# Patient Record
Sex: Male | Born: 1960 | Race: White | Hispanic: No | Marital: Married | Smoking: Current some day smoker
Health system: Southern US, Community
[De-identification: ages and names within clinical notes are randomized; demographics above are authoritative.]

## PROBLEM LIST (undated history)

## (undated) DIAGNOSIS — I428 Other cardiomyopathies: Secondary | ICD-10-CM

## (undated) DIAGNOSIS — N200 Calculus of kidney: Secondary | ICD-10-CM

## (undated) DIAGNOSIS — R0683 Snoring: Secondary | ICD-10-CM

## (undated) DIAGNOSIS — I4819 Other persistent atrial fibrillation: Secondary | ICD-10-CM

## (undated) DIAGNOSIS — E669 Obesity, unspecified: Secondary | ICD-10-CM

## (undated) DIAGNOSIS — I10 Essential (primary) hypertension: Secondary | ICD-10-CM

## (undated) DIAGNOSIS — E119 Type 2 diabetes mellitus without complications: Secondary | ICD-10-CM

---

## 2003-03-15 ENCOUNTER — Encounter: Payer: Self-pay | Admitting: Emergency Medicine

## 2003-03-15 ENCOUNTER — Emergency Department (HOSPITAL_COMMUNITY): Admission: EM | Admit: 2003-03-15 | Discharge: 2003-03-15 | Payer: Self-pay | Admitting: Emergency Medicine

## 2013-10-30 ENCOUNTER — Emergency Department (HOSPITAL_BASED_OUTPATIENT_CLINIC_OR_DEPARTMENT_OTHER)
Admission: EM | Admit: 2013-10-30 | Discharge: 2013-10-30 | Disposition: A | Discharge status: Home / Self Care | Payer: BC Managed Care – PPO | Attending: Emergency Medicine | Admitting: Emergency Medicine

## 2013-10-30 ENCOUNTER — Encounter (HOSPITAL_BASED_OUTPATIENT_CLINIC_OR_DEPARTMENT_OTHER): Payer: Self-pay | Admitting: Emergency Medicine

## 2013-10-30 ENCOUNTER — Emergency Department (HOSPITAL_BASED_OUTPATIENT_CLINIC_OR_DEPARTMENT_OTHER): Payer: BC Managed Care – PPO

## 2013-10-30 DIAGNOSIS — R079 Chest pain, unspecified: Secondary | ICD-10-CM | POA: Insufficient documentation

## 2013-10-30 DIAGNOSIS — R110 Nausea: Secondary | ICD-10-CM | POA: Insufficient documentation

## 2013-10-30 DIAGNOSIS — Z79899 Other long term (current) drug therapy: Secondary | ICD-10-CM | POA: Insufficient documentation

## 2013-10-30 DIAGNOSIS — F17200 Nicotine dependence, unspecified, uncomplicated: Secondary | ICD-10-CM | POA: Insufficient documentation

## 2013-10-30 DIAGNOSIS — R42 Dizziness and giddiness: Secondary | ICD-10-CM | POA: Insufficient documentation

## 2013-10-30 DIAGNOSIS — E119 Type 2 diabetes mellitus without complications: Secondary | ICD-10-CM | POA: Insufficient documentation

## 2013-10-30 DIAGNOSIS — I10 Essential (primary) hypertension: Secondary | ICD-10-CM | POA: Insufficient documentation

## 2013-10-30 DIAGNOSIS — E669 Obesity, unspecified: Secondary | ICD-10-CM | POA: Insufficient documentation

## 2013-10-30 DIAGNOSIS — R0602 Shortness of breath: Secondary | ICD-10-CM | POA: Insufficient documentation

## 2013-10-30 DIAGNOSIS — R000 Tachycardia, unspecified: Secondary | ICD-10-CM | POA: Insufficient documentation

## 2013-10-30 HISTORY — DX: Essential (primary) hypertension: I10

## 2013-10-30 HISTORY — DX: Type 2 diabetes mellitus without complications: E11.9

## 2013-10-30 LAB — CBC WITH DIFFERENTIAL/PLATELET
BASOS ABS: 0 10*3/uL (ref 0.0–0.1)
Basophils Relative: 0 % (ref 0–1)
EOS PCT: 2 % (ref 0–5)
Eosinophils Absolute: 0.4 10*3/uL (ref 0.0–0.7)
HEMATOCRIT: 46.5 % (ref 39.0–52.0)
HEMOGLOBIN: 16.2 g/dL (ref 13.0–17.0)
LYMPHS ABS: 3 10*3/uL (ref 0.7–4.0)
LYMPHS PCT: 17 % (ref 12–46)
MCH: 30.2 pg (ref 26.0–34.0)
MCHC: 34.8 g/dL (ref 30.0–36.0)
MCV: 86.6 fL (ref 78.0–100.0)
MONO ABS: 1.8 10*3/uL — AB (ref 0.1–1.0)
MONOS PCT: 10 % (ref 3–12)
NEUTROS ABS: 12.4 10*3/uL — AB (ref 1.7–7.7)
Neutrophils Relative %: 71 % (ref 43–77)
Platelets: 299 10*3/uL (ref 150–400)
RBC: 5.37 MIL/uL (ref 4.22–5.81)
RDW: 12.3 % (ref 11.5–15.5)
WBC: 17.5 10*3/uL — AB (ref 4.0–10.5)

## 2013-10-30 LAB — COMPREHENSIVE METABOLIC PANEL
ALBUMIN: 4.5 g/dL (ref 3.5–5.2)
ALT: 41 U/L (ref 0–53)
AST: 25 U/L (ref 0–37)
Alkaline Phosphatase: 47 U/L (ref 39–117)
BUN: 14 mg/dL (ref 6–23)
CALCIUM: 9.8 mg/dL (ref 8.4–10.5)
CO2: 27 mEq/L (ref 19–32)
CREATININE: 0.7 mg/dL (ref 0.50–1.35)
Chloride: 94 mEq/L — ABNORMAL LOW (ref 96–112)
GFR calc Af Amer: >90 mL/min (ref >90)
GFR calc non Af Amer: >90 mL/min (ref >90)
Glucose, Bld: 214 mg/dL — ABNORMAL HIGH (ref 70–99)
Potassium: 3.9 mEq/L (ref 3.7–5.3)
Sodium: 137 mEq/L (ref 137–147)
TOTAL PROTEIN: 8.1 g/dL (ref 6.0–8.3)
Total Bilirubin: 0.2 mg/dL — ABNORMAL LOW (ref 0.3–1.2)

## 2013-10-30 LAB — TROPONIN I: Troponin I: <0.3 ng/mL (ref <0.30)

## 2013-10-30 LAB — D-DIMER, QUANTITATIVE (NOT AT ARMC): D-Dimer, Quant: <0.27 ug/mL-FEU (ref 0.00–0.48)

## 2013-10-30 MED ORDER — ASPIRIN 81 MG PO CHEW
CHEWABLE_TABLET | ORAL | Status: AC
  Filled 2013-10-30: qty 4

## 2013-10-30 MED ORDER — ASPIRIN 81 MG PO CHEW: 81.0000 mg | CHEWABLE_TABLET | Freq: Every day | ORAL | Status: DC

## 2013-10-30 MED ORDER — NITROGLYCERIN 0.4 MG SL SUBL
0.4000 mg | SUBLINGUAL_TABLET | SUBLINGUAL | Status: DC | PRN
  Administered 2013-10-30: 0.4 mg via SUBLINGUAL
  Filled 2013-10-30: qty 25

## 2013-10-30 MED ORDER — NITROGLYCERIN 0.4 MG SL SUBL
SUBLINGUAL_TABLET | SUBLINGUAL | Status: AC
  Filled 2013-10-30: qty 1

## 2013-10-30 MED ORDER — ASPIRIN 81 MG PO CHEW
324.0000 mg | CHEWABLE_TABLET | Freq: Once | ORAL | Status: AC
  Administered 2013-10-30: 324 mg via ORAL

## 2013-10-30 NOTE — ED Provider Notes (Signed)
Care transferred from Dr. Jodi Mourning on 53 y.o. male With intermittent L sided CP.  HEART score 4. First trop negative. Dr. Jodi Mourning discussed inpt admission for ACS r/o vs close outpt f/u.  Pt would like to be d/c home. Delta trop negative. Return precautions given for new or worsening symptoms including return of CP, SOB, diaphoresis. He will call PCP tomorrow, Dr. [place] to arrange outpt f/u,possible stress test.   1. Chest pain       Shanna Cisco, MD 10/30/13 [phone]

## 2013-10-30 NOTE — Discharge Instructions (Signed)
Chest Pain (Nonspecific) °It is often hard to give a specific diagnosis for the cause of chest pain. There is always a chance that your pain could be related to something serious, such as a heart attack or a blood clot in the lungs. You need to follow up with your caregiver for further evaluation. °CAUSES  °· Heartburn. °· Pneumonia or bronchitis. °· Anxiety or stress. °· Inflammation around your heart (pericarditis) or lung (pleuritis or pleurisy). °· A blood clot in the lung. °· A collapsed lung (pneumothorax). It can develop suddenly on its own (spontaneous pneumothorax) or from injury (trauma) to the chest. °· Shingles infection (herpes zoster virus). °The chest wall is composed of bones, muscles, and cartilage. Any of these can be the source of the pain. °· The bones can be bruised by injury. °· The muscles or cartilage can be strained by coughing or overwork. °· The cartilage can be affected by inflammation and become sore (costochondritis). °DIAGNOSIS  °Lab tests or other studies, such as X-rays, electrocardiography, stress testing, or cardiac imaging, may be needed to find the cause of your pain.  °TREATMENT  °· Treatment depends on what may be causing your chest pain. Treatment may include: °· Acid blockers for heartburn. °· Anti-inflammatory medicine. °· Pain medicine for inflammatory conditions. °· Antibiotics if an infection is present. °· You may be advised to change lifestyle habits. This includes stopping smoking and avoiding alcohol, caffeine, and chocolate. °· You may be advised to keep your head raised (elevated) when sleeping. This reduces the chance of acid going backward from your stomach into your esophagus. °· Most of the time, nonspecific chest pain will improve within 2 to 3 days with rest and mild pain medicine. °HOME CARE INSTRUCTIONS  °· If antibiotics were prescribed, take your antibiotics as directed. Finish them even if you start to feel better. °· For the next few days, avoid physical  activities that bring on chest pain. Continue physical activities as directed. °· Do not smoke. °· Avoid drinking alcohol. °· Only take over-the-counter or prescription medicine for pain, discomfort, or fever as directed by your caregiver. °· Follow your caregiver's suggestions for further testing if your chest pain does not go away. °· Keep any follow-up appointments you made. If you do not go to an appointment, you could develop lasting (chronic) problems with pain. If there is any problem keeping an appointment, you must call to reschedule. °SEEK MEDICAL CARE IF:  °· You think you are having problems from the medicine you are taking. Read your medicine instructions carefully. °· Your chest pain does not go away, even after treatment. °· You develop a rash with blisters on your chest. °SEEK IMMEDIATE MEDICAL CARE IF:  °· You have increased chest pain or pain that spreads to your arm, neck, jaw, back, or abdomen. °· You develop shortness of breath, an increasing cough, or you are coughing up blood. °· You have severe back or abdominal pain, feel nauseous, or vomit. °· You develop severe weakness, fainting, or chills. °· You have a fever. °THIS IS AN EMERGENCY. Do not wait to see if the pain will go away. Get medical help at once. Call your local emergency services (911 in [place] Do not drive yourself to the hospital. °MAKE SURE YOU:  °· Understand these instructions. °· Will watch your condition. °· Will get help right away if you are not doing well or get worse. °Document Released: 05/07/2005 Document Revised: 10/20/2011 Document Reviewed: 03/02/2008 °ExitCare® Patient Information ©2014 [employer]   [employer]. ° ° °Aspirin and Your Heart °Aspirin affects the way your blood clots and helps "thin" the blood. Aspirin has many uses in heart disease. It may be used as a primary prevention to help reduce the risk of heart related events. It also can be used as a secondary measure to prevent more heart attacks or to prevent  additional damage from blood clots.  °ASPIRIN MAY HELP IF YOU: °· Have had a heart attack or chest pain. °· Have undergone open heart surgery such as CABG (Coronary Artery Bypass Surgery). °· Have had coronary angioplasty with or without stents. °· Have experienced a stroke or TIA (transient ischemic attack). °· Have peripheral vascular disease (PAD). °· Have chronic heart rhythm problems such as atrial fibrillation. °· Are at risk for heart disease. °BEFORE STARTING ASPIRIN °Before you start taking aspirin, your caregiver will need to review your medical history. Many things will need to be taken into consideration, such as: °· Smoking status. °· Blood pressure. °· Diabetes. °· Gender. °· Weight. °· Cholesterol level. °ASPIRIN DOSES °· Aspirin should only be taken on the advice of your caregiver. Talk to your caregiver about how much aspirin you should take. Aspirin comes in different doses such as: °· 81 mg. °· 162 mg. °· 325 mg. °· The aspirin dose you take may be affected by many factors, some of which include: °· Your current medications, especially if your are taking blood-thinners or anti-platelet medicine. °· Liver function. °· Heart disease risk. °· Age. °· Aspirin comes in two forms: °· Non-enteric-coated. This type of aspirin does not have a coating and is absorbed faster. Non-enteric coated aspirin is recommended for patients experiencing chest pain symptoms. This type of aspirin also comes in a chewable form. °· Enteric-coated. This means the aspirin has a special coating that releases the medicine very slowly. Enteric-coated aspirin causes less stomach upset. This type of aspirin should not be chewed or crushed. °ASPIRIN SIDE EFFECTS °Daily use of aspirin can increase your risk of serious side effects. Some of these include: °· Increased bleeding. This can range from a cut that does not stop bleeding to more serious problems such as stomach bleeding or bleeding into the brain (Intracerebral  bleeding). °· Increased bruising. °· Stomach upset. °· An allergic reaction such as red, itchy skin. °· Increased risk of bleeding when combined with non-steroidal anti-inflammatory medicine (NSAIDS). °· Alcohol should be drank in moderation when taking aspirin. Alcohol can increase the risk of stomach bleeding when taken with aspirin. °· Aspirin should not be given to children less than 18 years of age due to the association of Reye syndrome. Reye syndrome is a serious illness that can affect the brain and liver. Studies have linked Reye syndrome with aspirin use in children. °· People that have nasal polyps have an increased risk of developing an aspirin allergy. °SEEK MEDICAL CARE IF:  °· You develop an allergic reaction such as: °· Hives. °· Itchy skin. °· Swelling of the lips, tongue or face. °· You develop stomach pain. °· You have unusual bleeding or bruising. °· You have ringing in your ears. °SEEK IMMEDIATE MEDICAL CARE IF:  °· You have severe chest pain, especially if the pain is crushing or pressure-like and spreads to the arms, back, neck, or jaw. THIS IS AN EMERGENCY. Do not wait to see if the pain will go away. Get medical help at once. Call your local emergency services (911 in the [place] DO NOT drive yourself to the hospital. °·   You have stroke-like symptoms such as: °· Loss of vision. °· Difficulty talking. °· Numbness or weakness on one side of your body. °· Numbness or weakness in your arm or leg. °· Not thinking clearly or feeling confused. °· Your bowel movements are bloody, dark red or black in color. °· You vomit or cough up blood. °· You have blood in your urine. °· You have shortness of breath, coughing or wheezing. °MAKE SURE YOU:  °· Understand these instructions. °· Will monitor your condition. °· Seek immediate medical care if necessary. °Document Released: 07/10/2008 Document Revised: 11/22/2012 Document Reviewed: 07/10/2008 °ExitCare® Patient Information ©2014 [employer]. ° °

## 2013-10-30 NOTE — ED Notes (Signed)
Patient transported to X-ray via stretcher 

## 2013-10-30 NOTE — ED Notes (Signed)
Pt reports onset of left sided chest pain radiating to left shoulder/left side of his neck this morning.  Positive ROS for diaphoresis, nausea, SHOB.  Deep breathing makes the pain better, standing up and walking makes the pain worse.  No prior history of this pain before.

## 2013-10-30 NOTE — ED Notes (Signed)
Ginger ale offered. Pt tolerated well.

## 2013-10-30 NOTE — ED Provider Notes (Signed)
CSN: [id number]     Arrival date & time 10/30/13  1331 History   First MD Initiated Contact with Patient 10/30/13 1343     Chief Complaint  Patient presents with  . Chest Pain     (Consider location/radiation/quality/duration/timing/severity/associated sxs/prior Treatment) HPI Comments: 53 yo male with DM, obesity, smoking, htn, FH cardiac presents with left sided chest pain with radiation to left shoulder/ neck. Pt has had four 10 minute episodes.  No exertional component.  No hx of similar.  Standing make it worse.  Occasional dry cough.  No cardiac hx known.  Clarified nursing note, pt denies diaphoresis, he says he is always sweaty and there was no worsening during cp.  Mild sob with cp.  Patient denies blood clot history, active cancer, recent major trauma or surgery, unilateral leg swelling/ pain, recent long travel, hemoptysis or oral contraceptives. No back radiation.  No pain currently.   Patient is a 53 y.o. male presenting with chest pain. The history is provided by the patient.  Chest Pain Associated symptoms: nausea   Associated symptoms: no abdominal pain, no back pain, no fever, no headache, no shortness of breath and not vomiting     Past Medical History  Diagnosis Date  . Hypertension   . Diabetes mellitus without complication    History reviewed. No pertinent past surgical history. No family history on file. History  Substance Use Topics  . Smoking status: Current Some Day Smoker  . Smokeless tobacco: Not on file  . Alcohol Use: Yes    Review of Systems  Constitutional: Negative for fever and chills.  HENT: Negative for congestion.   Eyes: Negative for visual disturbance.  Respiratory: Negative for shortness of breath.   Cardiovascular: Positive for chest pain.  Gastrointestinal: Positive for nausea. Negative for vomiting and abdominal pain.  Genitourinary: Negative for dysuria and flank pain.  Musculoskeletal: Negative for back pain, neck pain and neck  stiffness.  Skin: Negative for rash.  Neurological: Positive for light-headedness. Negative for headaches.      Allergies  Review of patient's allergies indicates no known allergies.  Home Medications   Current Outpatient Rx  Name  Route  Sig  Dispense  Refill  . benazepril (LOTENSIN) 10 MG tablet   Oral   Take 10 mg by mouth daily.         . chlorthalidone (HYGROTON) 50 MG tablet   Oral   Take by mouth daily.          BP 120/76  Pulse 99  Temp(Src) 99.1 F (37.3 C) (Oral)  Resp 19  Ht 5\' 10"  (1.778 m)  Wt 288 lb (130.636 kg)  BMI 41.32 kg/m2  SpO2 99% Physical Exam  Nursing note and vitals reviewed. Constitutional: He is oriented to person, place, and time. He appears well-developed and well-nourished.  HENT:  Head: Normocephalic and atraumatic.  Eyes: Conjunctivae are normal. Right eye exhibits no discharge. Left eye exhibits no discharge.  Neck: Normal range of motion. Neck supple. No tracheal deviation present.  Cardiovascular: Regular rhythm.  Tachycardia present.   Pulmonary/Chest: Effort normal and breath sounds normal.  Abdominal: Soft. He exhibits no distension. There is no tenderness. There is no guarding.  Musculoskeletal: He exhibits no edema and no tenderness.  Neurological: He is alert and oriented to person, place, and time.  Skin: Skin is warm. No rash noted.  Psychiatric: He has a normal mood and affect.    ED Course  Procedures (including critical care time) Labs Review  Labs Reviewed  CBC WITH DIFFERENTIAL - Abnormal; Notable for the following:    WBC 17.5 (*)    Neutro Abs 12.4 (*)    Monocytes Absolute 1.8 (*)    All other components within normal limits  COMPREHENSIVE METABOLIC PANEL - Abnormal; Notable for the following:    Chloride 94 (*)    Glucose, Bld 214 (*)    Total Bilirubin 0.2 (*)    All other components within normal limits  TROPONIN I  D-DIMER, QUANTITATIVE  TROPONIN I   Imaging Review Dg Chest 2 View  10/30/2013    CLINICAL DATA:  Left side chest pain radiating to left shoulder and left-sided neck this morning, diaphoresis, shortness of breath, nausea, history hypertension, diabetes  EXAM: CHEST  2 VIEW  COMPARISON:  None  FINDINGS: Normal heart size and pulmonary vascularity.  Tortuous thoracic aorta.  Peribronchial thickening.  No infiltrate, pleural effusion or pneumothorax.  Probable old fracture of the lateral left ninth rib.  No acute osseous findings.  Specifically, no left upper chest or visualize left shoulder abnormality.  IMPRESSION: Mild bronchitic changes.   Electronically Signed   By: Ulyses Southward M.D.   On: 10/30/2013 14:32     EKG Interpretation   Date/Time:  Sunday October 30 2013 13:42:06 EDT Ventricular Rate:  110 PR Interval:  164 QRS Duration: 86 QT Interval:  322 QTC Calculation: 435 R Axis:   14 Text Interpretation:  Sinus tachycardia Possible Left atrial enlargement  Borderline ECG Non specific ST changes Confirmed by Petrina Melby  MD, Delbert Vu  ([id number]) on 10/30/2013 1:47:32 PM     Repeat EKG  Date: 10/30/2013  Rate: 93  Rhythm: normal sinus rhythm  QRS Axis: normal  Intervals: normal  ST/T Wave abnormalities: nonspecific ST changes  Conduction Disutrbances:none  Narrative Interpretation:   Old EKG Reviewed: unchanged   MDM   Final diagnoses:  None  Chest pain DM  Intermittent chest pain with risk factors. Initial ekg non specific findings. HEART score 4. ASA given.  No CP in ED> Repeat EKG no acute changes.  Rechecked, no cp during ED stay. D dimer neg, low risk. Pt feels well on recheck. Recommended observation at [facility] for cardiac work up since sxs intermittent and heart score 4. Patient has capacity to make decisions, understands benefits of hospitalization and risks of going home may result in worsening health condition including heart attack.  Family in the room.  Patient refuses hospital placement. Patient understands they may return at any time.    Signed out  to discharge after second troponin if neg.  Pt understands he can change his mind.    Enid Skeens, MD 10/30/13 [phone]

## 2013-11-25 ENCOUNTER — Encounter: Payer: Self-pay | Admitting: General Surgery

## 2013-11-25 DIAGNOSIS — R079 Chest pain, unspecified: Secondary | ICD-10-CM | POA: Insufficient documentation

## 2013-11-29 ENCOUNTER — Ambulatory Visit: Payer: BC Managed Care – PPO | Admitting: Cardiology

## 2015-02-18 ENCOUNTER — Encounter (HOSPITAL_COMMUNITY): Payer: Self-pay | Admitting: Emergency Medicine

## 2015-02-18 ENCOUNTER — Emergency Department (HOSPITAL_COMMUNITY): Payer: BC Managed Care – PPO

## 2015-02-18 ENCOUNTER — Emergency Department (HOSPITAL_COMMUNITY)
Admission: EM | Admit: 2015-02-18 | Discharge: 2015-02-18 | Disposition: A | Discharge status: Home / Self Care | Payer: BC Managed Care – PPO | Attending: Emergency Medicine | Admitting: Emergency Medicine

## 2015-02-18 DIAGNOSIS — I10 Essential (primary) hypertension: Secondary | ICD-10-CM | POA: Insufficient documentation

## 2015-02-18 DIAGNOSIS — Z79899 Other long term (current) drug therapy: Secondary | ICD-10-CM | POA: Diagnosis not present

## 2015-02-18 DIAGNOSIS — E119 Type 2 diabetes mellitus without complications: Secondary | ICD-10-CM | POA: Insufficient documentation

## 2015-02-18 DIAGNOSIS — Z87442 Personal history of urinary calculi: Secondary | ICD-10-CM | POA: Insufficient documentation

## 2015-02-18 DIAGNOSIS — Z7982 Long term (current) use of aspirin: Secondary | ICD-10-CM | POA: Insufficient documentation

## 2015-02-18 DIAGNOSIS — N23 Unspecified renal colic: Secondary | ICD-10-CM | POA: Insufficient documentation

## 2015-02-18 DIAGNOSIS — R52 Pain, unspecified: Secondary | ICD-10-CM

## 2015-02-18 DIAGNOSIS — Z720 Tobacco use: Secondary | ICD-10-CM | POA: Diagnosis not present

## 2015-02-18 DIAGNOSIS — R109 Unspecified abdominal pain: Secondary | ICD-10-CM | POA: Diagnosis present

## 2015-02-18 HISTORY — DX: Calculus of kidney: N20.0

## 2015-02-18 LAB — CBC WITH DIFFERENTIAL/PLATELET
Basophils Absolute: 0 10*3/uL (ref 0.0–0.1)
Basophils Relative: 0 % (ref 0–1)
EOS PCT: 0 % (ref 0–5)
Eosinophils Absolute: 0.1 10*3/uL (ref 0.0–0.7)
HCT: 48.2 % (ref 39.0–52.0)
Hemoglobin: 16.3 g/dL (ref 13.0–17.0)
LYMPHS ABS: 1.7 10*3/uL (ref 0.7–4.0)
Lymphocytes Relative: 12 % (ref 12–46)
MCH: 29.9 pg (ref 26.0–34.0)
MCHC: 33.8 g/dL (ref 30.0–36.0)
MCV: 88.3 fL (ref 78.0–100.0)
MONOS PCT: 7 % (ref 3–12)
Monocytes Absolute: 1.1 10*3/uL — ABNORMAL HIGH (ref 0.1–1.0)
Neutro Abs: 11.8 10*3/uL — ABNORMAL HIGH (ref 1.7–7.7)
Neutrophils Relative %: 81 % — ABNORMAL HIGH (ref 43–77)
PLATELETS: 244 10*3/uL (ref 150–400)
RBC: 5.46 MIL/uL (ref 4.22–5.81)
RDW: 13 % (ref 11.5–15.5)
WBC: 14.6 10*3/uL — ABNORMAL HIGH (ref 4.0–10.5)

## 2015-02-18 LAB — URINE MICROSCOPIC-ADD ON

## 2015-02-18 LAB — URINALYSIS, ROUTINE W REFLEX MICROSCOPIC
BILIRUBIN URINE: NEGATIVE
Glucose, UA: 250 mg/dL — AB
KETONES UR: 15 mg/dL — AB
LEUKOCYTES UA: NEGATIVE
NITRITE: NEGATIVE
PH: 6 (ref 5.0–8.0)
Protein, ur: NEGATIVE mg/dL
Specific Gravity, Urine: 1.017 (ref 1.005–1.030)
UROBILINOGEN UA: 0.2 mg/dL (ref 0.0–1.0)

## 2015-02-18 LAB — COMPREHENSIVE METABOLIC PANEL
ALK PHOS: 43 U/L (ref 38–126)
ALT: 46 U/L (ref 17–63)
AST: 32 U/L (ref 15–41)
Albumin: 4.6 g/dL (ref 3.5–5.0)
Anion gap: 11 (ref 5–15)
BUN: 10 mg/dL (ref 6–20)
CALCIUM: 9.7 mg/dL (ref 8.9–10.3)
CHLORIDE: 101 mmol/L (ref 101–111)
CO2: 25 mmol/L (ref 22–32)
Creatinine, Ser: 1 mg/dL (ref 0.61–1.24)
GFR calc Af Amer: >60 mL/min (ref >60)
Glucose, Bld: 199 mg/dL — ABNORMAL HIGH (ref 65–99)
POTASSIUM: 4.2 mmol/L (ref 3.5–5.1)
SODIUM: 137 mmol/L (ref 135–145)
Total Bilirubin: 0.7 mg/dL (ref 0.3–1.2)
Total Protein: 7.9 g/dL (ref 6.5–8.1)

## 2015-02-18 MED ORDER — OXYCODONE-ACETAMINOPHEN 5-325 MG PO TABS: 1.0000 | ORAL_TABLET | ORAL | Status: DC | PRN

## 2015-02-18 MED ORDER — ONDANSETRON HCL 4 MG/2ML IJ SOLN: 4.0000 mg | Freq: Once | INTRAMUSCULAR | Status: DC

## 2015-02-18 MED ORDER — HYDROMORPHONE HCL 1 MG/ML IJ SOLN
2.0000 mg | Freq: Once | INTRAMUSCULAR | Status: AC
  Administered 2015-02-18: 2 mg via INTRAMUSCULAR
  Filled 2015-02-18: qty 2

## 2015-02-18 MED ORDER — FENTANYL CITRATE (PF) 100 MCG/2ML IJ SOLN: 50.0000 ug | Freq: Once | INTRAMUSCULAR | Status: DC

## 2015-02-18 MED ORDER — ONDANSETRON 8 MG PO TBDP: 8.0000 mg | ORAL_TABLET | Freq: Three times a day (TID) | ORAL | Status: DC | PRN

## 2015-02-18 MED ORDER — KETOROLAC TROMETHAMINE 60 MG/2ML IM SOLN
60.0000 mg | Freq: Once | INTRAMUSCULAR | Status: AC
  Administered 2015-02-18: 60 mg via INTRAMUSCULAR
  Filled 2015-02-18: qty 2

## 2015-02-18 MED ORDER — ONDANSETRON 4 MG PO TBDP
8.0000 mg | ORAL_TABLET | Freq: Once | ORAL | Status: AC
  Administered 2015-02-18: 8 mg via ORAL
  Filled 2015-02-18: qty 2

## 2015-02-18 NOTE — ED Notes (Signed)
Pt undressed, in gown, on continuous pulse oximetry and blood pressure cuff; visitor at bedside 

## 2015-02-18 NOTE — ED Notes (Signed)
Pt in restroom 

## 2015-02-18 NOTE — ED Notes (Signed)
The pt given more pain meds im will have to wait for 30 minutes after the im injection

## 2015-02-18 NOTE — ED Notes (Signed)
The pt has not voided very  Much for  The past 24 -36 hours rt flank pain.  Hx of kidney stones.  Limited vein visible  Dr Freida Busman no orders for iv will give meds for pain im  instead

## 2015-02-18 NOTE — ED Notes (Signed)
The pts pain is better he has returned from c-t

## 2015-02-18 NOTE — Discharge Instructions (Signed)

## 2015-02-18 NOTE — ED Notes (Signed)
C/o R flank pain and R side pain since yesterday.  History of kidney stones.  Also reports dysuria, nausea, and dry heaves.

## 2015-02-18 NOTE — ED Provider Notes (Signed)
CSN: [id number]     Arrival date & time 02/18/15  1531 History   First MD Initiated Contact with Patient 02/18/15 1627     Chief Complaint  Patient presents with  . Flank Pain     (Consider location/radiation/quality/duration/timing/severity/associated sxs/prior Treatment) HPI Comments: Patient here complaining of colicky right-sided flank pain similar to his prior renal stones. Has been some dark urine but denies any hematuria. Last time he had a kidney stone was 10 years ago. Went to urgent care center and was prescribed doxycycline and Flomax. States that they did not do any imaging or blood work or urine tests. Denies any testicular swelling. No syncope or near CP. Denies any numbness or tingling to his legs. Symptoms persistent and nothing makes them better or worse.  Patient is a 54 y.o. male presenting with flank pain. The history is provided by the patient.  Flank Pain    Past Medical History  Diagnosis Date  . Hypertension   . Diabetes mellitus without complication   . Kidney stones    History reviewed. No pertinent past surgical history. Family History  Problem Relation Age of Onset  . Hypertension Mother   . Hypertension Father   . Prostate cancer Father    History  Substance Use Topics  . Smoking status: Current Some Day Smoker -- 1.00 packs/day    Types: Cigarettes  . Smokeless tobacco: Not on file     Comment: 1 pack per week  . Alcohol Use: Yes     Comment: 12 pk twice/week    Review of Systems  Genitourinary: Positive for flank pain.  All other systems reviewed and are negative.     Allergies  Review of patient's allergies indicates no known allergies.  Home Medications   Prior to Admission medications   Medication Sig Start Date End Date Taking? Authorizing Provider  aspirin 81 MG chewable tablet Chew 1 tablet (81 mg total) by mouth daily. 10/30/13   Toy Cookey, MD  chlorthalidone (HYGROTON) 50 MG tablet Take by mouth daily.    Historical  Provider, MD   BP 186/105 mmHg  Pulse 60  Temp(Src) 98.3 F (36.8 C) (Oral)  Resp 18  Ht 5\' 10"  (1.778 m)  Wt 290 lb (131.543 kg)  BMI 41.61 kg/m2  SpO2 100% Physical Exam  Constitutional: He is oriented to person, place, and time. He appears well-developed and well-nourished.  Non-toxic appearance. No distress.  HENT:  Head: Normocephalic and atraumatic.  Eyes: Conjunctivae, EOM and lids are normal. Pupils are equal, round, and reactive to light.  Neck: Normal range of motion. Neck supple. No tracheal deviation present. No thyroid mass present.  Cardiovascular: Normal rate, regular rhythm and normal heart sounds.  Exam reveals no gallop.   No murmur heard. Pulmonary/Chest: Effort normal and breath sounds normal. No stridor. No respiratory distress. He has no decreased breath sounds. He has no wheezes. He has no rhonchi. He has no rales.  Abdominal: Soft. Normal appearance and bowel sounds are normal. He exhibits no distension. There is no tenderness. There is no rebound and no CVA tenderness.  Musculoskeletal: Normal range of motion. He exhibits no edema or tenderness.  Neurological: He is alert and oriented to person, place, and time. He has normal strength. No cranial nerve deficit or sensory deficit. GCS eye subscore is 4. GCS verbal subscore is 5. GCS motor subscore is 6.  Skin: Skin is warm and dry. No abrasion and no rash noted.  Psychiatric: He has a normal mood  and affect. His speech is normal and behavior is normal.  Nursing note and vitals reviewed.   ED Course  Procedures (including critical care time) Labs Review Labs Reviewed  CBC WITH DIFFERENTIAL/PLATELET - Abnormal; Notable for the following:    WBC 14.6 (*)    Neutrophils Relative % 81 (*)    Neutro Abs 11.8 (*)    Monocytes Absolute 1.1 (*)    All other components within normal limits  COMPREHENSIVE METABOLIC PANEL  URINALYSIS, ROUTINE W REFLEX MICROSCOPIC (NOT AT [facility])    Imaging Review No results  found.   EKG Interpretation None      MDM   Final diagnoses:  Pain    Patient given 2 doses of hydromorphone as well as Toradol. CT scan results reviewed with him. Patient already has a prescription for Flomax and was told to take that. He was told to not take his doxycycline. Will be given referral to urology    Lorre Nick, MD 02/18/15 [phone]

## 2015-02-18 NOTE — ED Notes (Signed)
The pts pain is better  But not gone

## 2015-09-19 ENCOUNTER — Encounter (HOSPITAL_COMMUNITY): Payer: Self-pay | Admitting: Emergency Medicine

## 2015-09-19 ENCOUNTER — Inpatient Hospital Stay (HOSPITAL_COMMUNITY): Payer: BC Managed Care – PPO

## 2015-09-19 ENCOUNTER — Emergency Department (HOSPITAL_COMMUNITY): Payer: BC Managed Care – PPO

## 2015-09-19 ENCOUNTER — Inpatient Hospital Stay (HOSPITAL_COMMUNITY)
Admission: EM | Admit: 2015-09-19 | Discharge: 2015-09-22 | DRG: 286 | Disposition: A | Discharge status: Home / Self Care | Payer: BC Managed Care – PPO | Attending: Cardiovascular Disease | Admitting: Cardiovascular Disease

## 2015-09-19 DIAGNOSIS — J810 Acute pulmonary edema: Secondary | ICD-10-CM | POA: Diagnosis present

## 2015-09-19 DIAGNOSIS — G9341 Metabolic encephalopathy: Secondary | ICD-10-CM | POA: Diagnosis not present

## 2015-09-19 DIAGNOSIS — I428 Other cardiomyopathies: Secondary | ICD-10-CM | POA: Diagnosis present

## 2015-09-19 DIAGNOSIS — J9601 Acute respiratory failure with hypoxia: Secondary | ICD-10-CM | POA: Diagnosis present

## 2015-09-19 DIAGNOSIS — T4275XA Adverse effect of unspecified antiepileptic and sedative-hypnotic drugs, initial encounter: Secondary | ICD-10-CM | POA: Diagnosis not present

## 2015-09-19 DIAGNOSIS — I429 Cardiomyopathy, unspecified: Secondary | ICD-10-CM | POA: Diagnosis not present

## 2015-09-19 DIAGNOSIS — I4891 Unspecified atrial fibrillation: Secondary | ICD-10-CM

## 2015-09-19 DIAGNOSIS — J9600 Acute respiratory failure, unspecified whether with hypoxia or hypercapnia: Secondary | ICD-10-CM

## 2015-09-19 DIAGNOSIS — F17210 Nicotine dependence, cigarettes, uncomplicated: Secondary | ICD-10-CM | POA: Diagnosis present

## 2015-09-19 DIAGNOSIS — I5041 Acute combined systolic (congestive) and diastolic (congestive) heart failure: Secondary | ICD-10-CM | POA: Diagnosis present

## 2015-09-19 DIAGNOSIS — Z6841 Body Mass Index (BMI) 40.0 and over, adult: Secondary | ICD-10-CM | POA: Diagnosis not present

## 2015-09-19 DIAGNOSIS — N179 Acute kidney failure, unspecified: Secondary | ICD-10-CM | POA: Diagnosis present

## 2015-09-19 DIAGNOSIS — Z7289 Other problems related to lifestyle: Secondary | ICD-10-CM | POA: Diagnosis not present

## 2015-09-19 DIAGNOSIS — I420 Dilated cardiomyopathy: Secondary | ICD-10-CM | POA: Diagnosis present

## 2015-09-19 DIAGNOSIS — E669 Obesity, unspecified: Secondary | ICD-10-CM | POA: Diagnosis present

## 2015-09-19 DIAGNOSIS — E876 Hypokalemia: Secondary | ICD-10-CM | POA: Diagnosis not present

## 2015-09-19 DIAGNOSIS — I110 Hypertensive heart disease with heart failure: Secondary | ICD-10-CM | POA: Diagnosis present

## 2015-09-19 DIAGNOSIS — I480 Paroxysmal atrial fibrillation: Principal | ICD-10-CM | POA: Diagnosis present

## 2015-09-19 DIAGNOSIS — I481 Persistent atrial fibrillation: Secondary | ICD-10-CM | POA: Diagnosis present

## 2015-09-19 DIAGNOSIS — E1165 Type 2 diabetes mellitus with hyperglycemia: Secondary | ICD-10-CM | POA: Diagnosis present

## 2015-09-19 DIAGNOSIS — Z01818 Encounter for other preprocedural examination: Secondary | ICD-10-CM

## 2015-09-19 DIAGNOSIS — I5021 Acute systolic (congestive) heart failure: Secondary | ICD-10-CM | POA: Diagnosis not present

## 2015-09-19 DIAGNOSIS — Z4659 Encounter for fitting and adjustment of other gastrointestinal appliance and device: Secondary | ICD-10-CM

## 2015-09-19 DIAGNOSIS — Z8249 Family history of ischemic heart disease and other diseases of the circulatory system: Secondary | ICD-10-CM

## 2015-09-19 DIAGNOSIS — R079 Chest pain, unspecified: Secondary | ICD-10-CM | POA: Diagnosis not present

## 2015-09-19 DIAGNOSIS — R4182 Altered mental status, unspecified: Secondary | ICD-10-CM | POA: Insufficient documentation

## 2015-09-19 DIAGNOSIS — I5031 Acute diastolic (congestive) heart failure: Secondary | ICD-10-CM

## 2015-09-19 DIAGNOSIS — R778 Other specified abnormalities of plasma proteins: Secondary | ICD-10-CM | POA: Diagnosis present

## 2015-09-19 DIAGNOSIS — I4819 Other persistent atrial fibrillation: Secondary | ICD-10-CM | POA: Diagnosis present

## 2015-09-19 DIAGNOSIS — R0602 Shortness of breath: Secondary | ICD-10-CM | POA: Diagnosis present

## 2015-09-19 DIAGNOSIS — R7989 Other specified abnormal findings of blood chemistry: Secondary | ICD-10-CM | POA: Diagnosis present

## 2015-09-19 HISTORY — DX: Snoring: R06.83

## 2015-09-19 HISTORY — DX: Obesity, unspecified: E66.9

## 2015-09-19 HISTORY — DX: Other persistent atrial fibrillation: I48.19

## 2015-09-19 HISTORY — DX: Other cardiomyopathies: I42.8

## 2015-09-19 LAB — I-STAT ARTERIAL BLOOD GAS, ED
ACID-BASE DEFICIT: 10 mmol/L — AB (ref 0.0–2.0)
ACID-BASE DEFICIT: 6 mmol/L — AB (ref 0.0–2.0)
Bicarbonate: 20.8 mEq/L (ref 20.0–24.0)
Bicarbonate: 20.3 mEq/L (ref 20.0–24.0)
O2 SAT: 96 %
O2 Saturation: 98 %
PH ART: 7.096 — AB (ref 7.350–7.450)
PH ART: 7.309 — AB (ref 7.350–7.450)
TCO2: 23 mmol/L (ref 0–100)
TCO2: 21 mmol/L (ref 0–100)
pCO2 arterial: 67.7 mmHg (ref 35.0–45.0)
pCO2 arterial: 40.3 mmHg (ref 35.0–45.0)
pO2, Arterial: 111 mmHg — ABNORMAL HIGH (ref 80.0–100.0)
pO2, Arterial: 109 mmHg — ABNORMAL HIGH (ref 80.0–100.0)

## 2015-09-19 LAB — MAGNESIUM: Magnesium: 2.3 mg/dL (ref 1.7–2.4)

## 2015-09-19 LAB — I-STAT CHEM 8, ED
BUN: 16 mg/dL (ref 6–20)
CALCIUM ION: 1.11 mmol/L — AB (ref 1.12–1.23)
CREATININE: 1.1 mg/dL (ref 0.61–1.24)
Chloride: 102 mmol/L (ref 101–111)
Glucose, Bld: 280 mg/dL — ABNORMAL HIGH (ref 65–99)
HCT: 54 % — ABNORMAL HIGH (ref 39.0–52.0)
Hemoglobin: 18.4 g/dL — ABNORMAL HIGH (ref 13.0–17.0)
Potassium: 3.7 mmol/L (ref 3.5–5.1)
Sodium: 141 mmol/L (ref 135–145)
TCO2: 25 mmol/L (ref 0–100)

## 2015-09-19 LAB — CBC WITH DIFFERENTIAL/PLATELET
BASOS ABS: 0.2 10*3/uL — AB (ref 0.0–0.1)
Basophils Relative: 1 %
EOS ABS: 0.8 10*3/uL — AB (ref 0.0–0.7)
Eosinophils Relative: 5 %
HCT: 50 % (ref 39.0–52.0)
Hemoglobin: 15.7 g/dL (ref 13.0–17.0)
LYMPHS ABS: 8.3 10*3/uL — AB (ref 0.7–4.0)
LYMPHS PCT: 55 %
MCH: 28.6 pg (ref 26.0–34.0)
MCHC: 31.4 g/dL (ref 30.0–36.0)
MCV: 91.1 fL (ref 78.0–100.0)
Monocytes Absolute: 0.9 10*3/uL (ref 0.1–1.0)
Monocytes Relative: 6 %
NEUTROS ABS: 5 10*3/uL (ref 1.7–7.7)
Neutrophils Relative %: 33 %
PLATELETS: 283 10*3/uL (ref 150–400)
RBC: 5.49 MIL/uL (ref 4.22–5.81)
RDW: 13.3 % (ref 11.5–15.5)
WBC: 15.2 10*3/uL — ABNORMAL HIGH (ref 4.0–10.5)

## 2015-09-19 LAB — URINE MICROSCOPIC-ADD ON: RBC / HPF: NONE SEEN RBC/hpf (ref 0–5)

## 2015-09-19 LAB — I-STAT TROPONIN, ED: Troponin i, poc: 0.07 ng/mL (ref 0.00–0.08)

## 2015-09-19 LAB — COMPREHENSIVE METABOLIC PANEL
ALBUMIN: 3.5 g/dL (ref 3.5–5.0)
ALK PHOS: 40 U/L (ref 38–126)
ALT: 28 U/L (ref 17–63)
ANION GAP: 16 — AB (ref 5–15)
AST: 40 U/L (ref 15–41)
BILIRUBIN TOTAL: 0.9 mg/dL (ref 0.3–1.2)
BUN: 11 mg/dL (ref 6–20)
CALCIUM: 8.7 mg/dL — AB (ref 8.9–10.3)
CO2: 20 mmol/L — ABNORMAL LOW (ref 22–32)
Chloride: 105 mmol/L (ref 101–111)
Creatinine, Ser: 1.41 mg/dL — ABNORMAL HIGH (ref 0.61–1.24)
GFR, EST NON AFRICAN AMERICAN: 56 mL/min — AB (ref >60)
GLUCOSE: 288 mg/dL — AB (ref 65–99)
Potassium: 3.6 mmol/L (ref 3.5–5.1)
Sodium: 141 mmol/L (ref 135–145)
TOTAL PROTEIN: 6.3 g/dL — AB (ref 6.5–8.1)

## 2015-09-19 LAB — RAPID URINE DRUG SCREEN, HOSP PERFORMED
Amphetamines: NOT DETECTED
BARBITURATES: NOT DETECTED
Benzodiazepines: NOT DETECTED
Cocaine: NOT DETECTED
Opiates: NOT DETECTED
TETRAHYDROCANNABINOL: NOT DETECTED

## 2015-09-19 LAB — GLUCOSE, CAPILLARY
GLUCOSE-CAPILLARY: 128 mg/dL — AB (ref 65–99)
Glucose-Capillary: 117 mg/dL — ABNORMAL HIGH (ref 65–99)
Glucose-Capillary: 81 mg/dL (ref 65–99)
Glucose-Capillary: 162 mg/dL — ABNORMAL HIGH (ref 65–99)

## 2015-09-19 LAB — URINALYSIS, ROUTINE W REFLEX MICROSCOPIC
Bilirubin Urine: NEGATIVE
GLUCOSE, UA: NEGATIVE mg/dL
Hgb urine dipstick: NEGATIVE
KETONES UR: NEGATIVE mg/dL
LEUKOCYTES UA: NEGATIVE
Nitrite: NEGATIVE
PROTEIN: 100 mg/dL — AB
Specific Gravity, Urine: 1.024 (ref 1.005–1.030)
pH: 5.5 (ref 5.0–8.0)

## 2015-09-19 LAB — PROCALCITONIN: PROCALCITONIN: 0.29 ng/mL

## 2015-09-19 LAB — BRAIN NATRIURETIC PEPTIDE: B NATRIURETIC PEPTIDE 5: 772.9 pg/mL — AB (ref 0.0–100.0)

## 2015-09-19 LAB — HEPARIN LEVEL (UNFRACTIONATED)
HEPARIN UNFRACTIONATED: 0.37 [IU]/mL (ref 0.30–0.70)
Heparin Unfractionated: 0.35 IU/mL (ref 0.30–0.70)

## 2015-09-19 LAB — MRSA PCR SCREENING: MRSA BY PCR: NEGATIVE

## 2015-09-19 LAB — TSH: TSH: 2.044 u[IU]/mL (ref 0.350–4.500)

## 2015-09-19 LAB — ETHANOL: Alcohol, Ethyl (B): <5 mg/dL (ref <5)

## 2015-09-19 LAB — LIPASE, BLOOD: LIPASE: 32 U/L (ref 11–51)

## 2015-09-19 LAB — TROPONIN I
TROPONIN I: 0.11 ng/mL — AB (ref <0.031)
Troponin I: 0.1 ng/mL — ABNORMAL HIGH (ref <0.031)

## 2015-09-19 LAB — LACTIC ACID, PLASMA
LACTIC ACID, VENOUS: 1.5 mmol/L (ref 0.5–2.0)
Lactic Acid, Venous: 1.4 mmol/L (ref 0.5–2.0)

## 2015-09-19 LAB — I-STAT CG4 LACTIC ACID, ED: Lactic Acid, Venous: 5.26 mmol/L (ref 0.5–2.0)

## 2015-09-19 MED ORDER — FENTANYL CITRATE (PF) 2500 MCG/50ML IJ SOLN
150.0000 ug/h | INTRAMUSCULAR | Status: DC
  Administered 2015-09-19: 150 ug/h via INTRAVENOUS
  Filled 2015-09-19: qty 50

## 2015-09-19 MED ORDER — NITROGLYCERIN IN D5W 200-5 MCG/ML-% IV SOLN
INTRAVENOUS | Status: AC
  Filled 2015-09-19: qty 250

## 2015-09-19 MED ORDER — PIPERACILLIN-TAZOBACTAM 3.375 G IVPB
3.3750 g | Freq: Three times a day (TID) | INTRAVENOUS | Status: DC
  Administered 2015-09-19 – 2015-09-20 (×3): 3.375 g via INTRAVENOUS
  Filled 2015-09-19 (×5): qty 50

## 2015-09-19 MED ORDER — ROCURONIUM BROMIDE 50 MG/5ML IV SOLN
100.0000 mg | Freq: Once | INTRAVENOUS | Status: AC
  Administered 2015-09-19: 100 mg via INTRAVENOUS
  Filled 2015-09-19: qty 10

## 2015-09-19 MED ORDER — PANTOPRAZOLE SODIUM 40 MG IV SOLR
40.0000 mg | Freq: Every day | INTRAVENOUS | Status: DC
  Administered 2015-09-19: 40 mg via INTRAVENOUS
  Filled 2015-09-19: qty 40

## 2015-09-19 MED ORDER — SODIUM CHLORIDE 0.9 % IV SOLN
25.0000 ug/h | INTRAVENOUS | Status: DC
  Administered 2015-09-19: 100 ug/h via INTRAVENOUS
  Filled 2015-09-19: qty 50

## 2015-09-19 MED ORDER — SODIUM CHLORIDE 0.9 % IV SOLN
1500.0000 mg | Freq: Once | INTRAVENOUS | Status: AC
  Administered 2015-09-19: 1500 mg via INTRAVENOUS
  Filled 2015-09-19: qty 1500

## 2015-09-19 MED ORDER — SODIUM CHLORIDE 0.9 % IV SOLN
1.0000 g | Freq: Once | INTRAVENOUS | Status: AC
  Administered 2015-09-19: 1 g via INTRAVENOUS
  Filled 2015-09-19: qty 10

## 2015-09-19 MED ORDER — FENTANYL CITRATE (PF) 100 MCG/2ML IJ SOLN
50.0000 ug | Freq: Once | INTRAMUSCULAR | Status: AC
  Administered 2015-09-21: 50 ug via INTRAVENOUS
  Filled 2015-09-19: qty 2

## 2015-09-19 MED ORDER — MIDAZOLAM HCL 2 MG/2ML IJ SOLN: 2.0000 mg | INTRAMUSCULAR | Status: DC | PRN

## 2015-09-19 MED ORDER — PROCAINAMIDE HCL 100 MG/ML IJ SOLN
100.0000 mg | Freq: Once | INTRAMUSCULAR | Status: AC
  Administered 2015-09-19: 100 mg via INTRAVENOUS
  Filled 2015-09-19: qty 1

## 2015-09-19 MED ORDER — FUROSEMIDE 10 MG/ML IJ SOLN
INTRAMUSCULAR | Status: AC
  Filled 2015-09-19: qty 4

## 2015-09-19 MED ORDER — ETOMIDATE 2 MG/ML IV SOLN
30.0000 mg | Freq: Once | INTRAVENOUS | Status: AC
  Administered 2015-09-19: 30 mg via INTRAVENOUS

## 2015-09-19 MED ORDER — NITROGLYCERIN IN D5W 200-5 MCG/ML-% IV SOLN
100.0000 ug/min | Freq: Once | INTRAVENOUS | Status: AC
  Administered 2015-09-19: 50 ug/min via INTRAVENOUS

## 2015-09-19 MED ORDER — SODIUM CHLORIDE 0.9 % IV SOLN
Freq: Once | INTRAVENOUS | Status: AC
  Administered 2015-09-19: 03:00:00 via INTRAVENOUS

## 2015-09-19 MED ORDER — HEPARIN (PORCINE) IN NACL 100-0.45 UNIT/ML-% IJ SOLN
1700.0000 [IU]/h | INTRAMUSCULAR | Status: DC
  Administered 2015-09-19 – 2015-09-21 (×4): 1500 [IU]/h via INTRAVENOUS
  Filled 2015-09-19 (×4): qty 250

## 2015-09-19 MED ORDER — LEVALBUTEROL HCL 0.63 MG/3ML IN NEBU
0.6300 mg | INHALATION_SOLUTION | RESPIRATORY_TRACT | Status: DC | PRN
  Filled 2015-09-19: qty 3

## 2015-09-19 MED ORDER — SODIUM CHLORIDE 0.9 % IV SOLN
INTRAVENOUS | Status: DC
  Administered 2015-09-19: 06:00:00 via INTRAVENOUS

## 2015-09-19 MED ORDER — CHLORHEXIDINE GLUCONATE 0.12% ORAL RINSE (MEDLINE KIT)
15.0000 mL | Freq: Two times a day (BID) | OROMUCOSAL | Status: DC
  Administered 2015-09-19: 15 mL via OROMUCOSAL

## 2015-09-19 MED ORDER — FUROSEMIDE 10 MG/ML IJ SOLN
40.0000 mg | Freq: Once | INTRAMUSCULAR | Status: AC
  Administered 2015-09-19: 40 mg via INTRAVENOUS

## 2015-09-19 MED ORDER — SODIUM CHLORIDE 0.9 % IV SOLN: INTRAVENOUS | Status: DC | PRN

## 2015-09-19 MED ORDER — ANTISEPTIC ORAL RINSE SOLUTION (CORINZ)
7.0000 mL | Freq: Four times a day (QID) | OROMUCOSAL | Status: DC
  Administered 2015-09-19: 7 mL via OROMUCOSAL

## 2015-09-19 MED ORDER — VANCOMYCIN HCL 10 G IV SOLR
1250.0000 mg | Freq: Two times a day (BID) | INTRAVENOUS | Status: DC
  Administered 2015-09-19 – 2015-09-20 (×2): 1250 mg via INTRAVENOUS
  Filled 2015-09-19 (×4): qty 1250

## 2015-09-19 MED ORDER — NOREPINEPHRINE BITARTRATE 1 MG/ML IV SOLN
2.0000 ug/min | INTRAVENOUS | Status: DC
  Filled 2015-09-19: qty 4

## 2015-09-19 MED ORDER — PIPERACILLIN-TAZOBACTAM 3.375 G IVPB 30 MIN
3.3750 g | Freq: Once | INTRAVENOUS | Status: AC
  Administered 2015-09-19: 3.375 g via INTRAVENOUS
  Filled 2015-09-19: qty 50

## 2015-09-19 MED ORDER — SODIUM CHLORIDE 0.9 % IV SOLN: 250.0000 mL | INTRAVENOUS | Status: DC | PRN

## 2015-09-19 MED ORDER — INSULIN ASPART 100 UNIT/ML ~~LOC~~ SOLN
0.0000 [IU] | SUBCUTANEOUS | Status: DC
  Administered 2015-09-19: 3 [IU] via SUBCUTANEOUS
  Administered 2015-09-19: 4 [IU] via SUBCUTANEOUS
  Administered 2015-09-20 – 2015-09-21 (×2): 3 [IU] via SUBCUTANEOUS

## 2015-09-19 MED ORDER — PROCAINAMIDE HCL 100 MG/ML IJ SOLN
300.0000 mg | Freq: Once | INTRAMUSCULAR | Status: DC
  Filled 2015-09-19: qty 3

## 2015-09-19 MED ORDER — FENTANYL BOLUS VIA INFUSION
50.0000 ug | INTRAVENOUS | Status: DC | PRN
  Filled 2015-09-19: qty 50

## 2015-09-19 NOTE — Progress Notes (Signed)
Patient transferred from ED to [facility] without any complications.

## 2015-09-19 NOTE — Discharge Instructions (Signed)

## 2015-09-19 NOTE — Progress Notes (Signed)
eLink Physician-Brief Progress Note Patient Name: YOSMAR JOHNES DOB: March 01, 1961 MRN: [id number]   Date of Service  09/19/2015  HPI/Events of Note  Camera check patient post extubation today. No respiratory distress. Resting comfortably in bed.  eICU Interventions  Continue current plan of care.     Intervention Category Intermediate Interventions: Other:  Lawanda Cousins 09/19/2015, 10:06 PM

## 2015-09-19 NOTE — Progress Notes (Signed)
ANTICOAGULATION CONSULT NOTE   Pharmacy Consult for heparin Indication: atrial fibrillation  No Known Allergies  Patient Measurements: Height: 6' (182.9 cm) Weight: 258 lb 9.6 oz (117.3 kg) IBW/kg (Calculated) : 77.6 Heparin Dosing Weight: 105kg  Vital Signs: Temp: 97.8 F (36.6 C) (02/08 0800) Temp Source: Oral (02/08 0800) BP: 133/85 mmHg (02/08 1215) Pulse Rate: 74 (02/08 1215)  Labs:  Recent Labs  09/19/15 0240 09/19/15 0319 09/19/15 0820 09/19/15 1236  HGB 15.7 18.4*  --   --   HCT 50.0 54.0*  --   --   PLT 283  --   --   --   HEPARINUNFRC  --   --   --  0.37  CREATININE 1.41* 1.10  --   --   TROPONINI  --   --  0.10*  --     Estimated Creatinine Clearance: 103.9 mL/min (by C-G formula based on Cr of 1.1).    Assessment: 55yo male c/o CP and SOB, found to be in Afib s/p successful DCCV in ED. He continues on heparin.and at goal on 1500 units/hr. Plans for NOAC when more stable (extubated today).  Goal of Therapy:  Heparin level 0.3-0.7 units/ml Monitor platelets by anticoagulation protocol: Yes   Plan:  -No heparin changes needed -Heparin level in 6 hours and daily wth CBC daily -Will follow oral anticoagulation plans  Harland German, Pharm D 09/19/2015 1:21 PM

## 2015-09-19 NOTE — Progress Notes (Signed)
09/19/2015 1215- Pt extubated to 4L [place]. No stridor noted. Pt BBS equal and dim. Pt able to cough up secretions. Pt able to vocalize name. RN will continue to monitor.

## 2015-09-19 NOTE — Progress Notes (Signed)
MD placed pt on wean 

## 2015-09-19 NOTE — ED Provider Notes (Signed)
CSN: [id number]     Arrival date & time 09/19/15  0225 History  By signing my name below, I, Marvin Estrada, attest that this documentation has been prepared under the direction and in the presence of Tomasita Crumble, MD. Electronically Signed: Bethel Estrada, ED Scribe. 09/19/2015. 3:32 AM   Chief Complaint  Patient presents with  . Tachycardia    The patient went to the fire station due to chest pain and sob that started yesterday.  Fire Department called EMS.  EMS  placed an IV  and gave 6mg , 12mg  and 12mg .  EMS did give him 20mg  of Cardizem, then after gave him 10mg  of Cardizem and he is now on 5mg  of Cardizem.  Marvin Estrada Shortness of Breath   Level V caveat secondary to the acuity of the presenting condition  The history is provided by the EMS personnel. No language interpreter was used.   Brought in by EMS, Marvin Estrada is a 55 y.o. male with history of HTN and DM who presents to the Emergency Department complaining of chest pain and SOB with onset yesterday. Associated symptoms include diaphoresis. Pt denies recent illness. He was noted to be in a-fib by EMS and given adenosine without improvement.   Past Medical History  Diagnosis Date  . Hypertension   . Diabetes mellitus without complication (HCC)   . Kidney stones    History reviewed. No pertinent past surgical history. Family History  Problem Relation Age of Onset  . Hypertension Mother   . Hypertension Father   . Prostate cancer Father    Social History  Substance Use Topics  . Smoking status: Current Some Day Smoker -- 1.00 packs/day    Types: Cigarettes  . Smokeless tobacco: None     Comment: 1 pack per week  . Alcohol Use: Yes     Comment: 12 pk twice/week    Review of Systems  Unable to perform ROS: Acuity of condition    Allergies  Review of patient's allergies indicates no known allergies.  Home Medications   Prior to Admission medications   Medication Sig Start Date End Date Taking? Authorizing  Provider  acetaminophen (TYLENOL) 500 MG tablet Take 1,000 mg by mouth every 6 (six) hours as needed for moderate pain.   Yes Historical Provider, MD  fluticasone (FLONASE) 50 MCG/ACT nasal spray Place 1 spray into both nostrils as needed for allergies or rhinitis.   Yes Historical Provider, MD  omeprazole (PRILOSEC) 20 MG capsule Take 20 mg by mouth as needed (FOR HEARTBURN).   Yes Historical Provider, MD   BP 115/80 mmHg  Pulse 63  Temp(Src) 99.5 F (37.5 C) (Core (Comment))  Resp 26  SpO2 98% Physical Exam  Constitutional: Vital signs are normal. He appears well-developed and well-nourished.  Non-toxic appearance. He does not appear ill. He appears distressed.  HENT:  Head: Normocephalic and atraumatic.  Nose: Nose normal.  Mouth/Throat: Oropharynx is clear and moist. No oropharyngeal exudate.  Eyes: Conjunctivae and EOM are normal. Pupils are equal, round, and reactive to light. No scleral icterus.  Neck: Normal range of motion. Neck supple. No tracheal deviation, no edema, no erythema and normal range of motion present. No thyroid mass and no thyromegaly present.  Cardiovascular: S1 normal, S2 normal, normal heart sounds, intact distal pulses and normal pulses.  An irregularly irregular rhythm present. Tachycardia present.  Exam reveals no gallop and no friction rub.   No murmur heard. Pulmonary/Chest: Accessory muscle usage present. Tachypnea noted. He is in  respiratory distress. He has no wheezes. He has no rhonchi. He has no rales.  Increased work of breathing  Abdominal: Soft. Normal appearance and bowel sounds are normal. He exhibits no distension, no ascites and no mass. There is no hepatosplenomegaly. There is no tenderness. There is no rebound, no guarding and no CVA tenderness.  Musculoskeletal: Normal range of motion. He exhibits no edema or tenderness.  Lymphadenopathy:    He has no cervical adenopathy.  Neurological: He has normal strength. No sensory deficit.  Drowsy,  minimally responsive to questions, going in and out of consciousness.   Skin: Skin is warm and intact. No petechiae and no rash noted. He is diaphoretic. There is cyanosis. No erythema. No pallor.  Nursing note and vitals reviewed.   ED Course  Procedures (including critical care time)  EMERGENT INTUBATION PROCEDURE NOTE INDICATION: respiratory failure  TECHNIQUE: Unable to obtain consent because of emergent medical necessity.  After pre-oxygenating the patient for 3 minutes, a modified rapid-sequence induction was performed using etomidate and rocuronium with cricoid pressure. Using direct laryngoscopy and 8.59mm cuffed endotracheal tube was placed and secured.  Placement was confirmed with by auscultation, by CXR and ETCO2 monitor.  COMPLICATIONS: None. The patient tolerated the procedure well with no complications. POST PROCEDURE CXR: tube position acceptable   CRITICAL CARE Performed by: Tomasita Crumble, MD Total critical care time: 80 minutes Critical care time was exclusive of separately billable procedures and treating other patients. Critical care was necessary to treat or prevent imminent or life-threatening deterioration. Critical care was time spent personally by me on the following activities: development of treatment plan with patient and/or surrogate as well as nursing, discussions with consultants, evaluation of patient's response to treatment, examination of patient, obtaining history from patient or surrogate, ordering and performing treatments and interventions, ordering and review of laboratory studies, ordering and review of radiographic studies, pulse oximetry and re-evaluation of patient's condition.    COORDINATION OF CARE: 2:50 AM I updated the patient's family and discussed the treatment plan.   3:10 AM-Consult complete with Dr. Leeann Must (Cardiology). Patient case explained and discussed.  Call ended at 3:14 AM  3:31 AM D/w Dr. Tresa Endo who has seen the pt. He is in  agreement with the treatment plan.    Labs Review Labs Reviewed  COMPREHENSIVE METABOLIC PANEL - Abnormal; Notable for the following:    CO2 20 (*)    Glucose, Bld 288 (*)    Creatinine, Ser 1.41 (*)    Calcium 8.7 (*)    Total Protein 6.3 (*)    GFR calc non Af Amer 56 (*)    Anion gap 16 (*)    All other components within normal limits  CBC WITH DIFFERENTIAL/PLATELET - Abnormal; Notable for the following:    WBC 15.2 (*)    Lymphs Abs 8.3 (*)    Eosinophils Absolute 0.8 (*)    Basophils Absolute 0.2 (*)    All other components within normal limits  I-STAT ARTERIAL BLOOD GAS, ED - Abnormal; Notable for the following:    pH, Arterial 7.096 (*)    pCO2 arterial 67.7 (*)    pO2, Arterial 111.0 (*)    Acid-base deficit 10.0 (*)    All other components within normal limits  I-STAT CG4 LACTIC ACID, ED - Abnormal; Notable for the following:    Lactic Acid, Venous 5.26 (*)    All other components within normal limits  I-STAT CHEM 8, ED - Abnormal; Notable for the following:  Glucose, Bld 280 (*)    Calcium, Ion 1.11 (*)    Hemoglobin 18.4 (*)    HCT 54.0 (*)    All other components within normal limits  CULTURE, BLOOD (ROUTINE X 2)  CULTURE, BLOOD (ROUTINE X 2)  CULTURE, BLOOD (ROUTINE X 2) W REFLEX TO ID PANEL  LIPASE, BLOOD  ETHANOL  MAGNESIUM  CBC  URINALYSIS, ROUTINE W REFLEX MICROSCOPIC (NOT AT [facility])  BRAIN NATRIURETIC PEPTIDE  URINE RAPID DRUG SCREEN, HOSP PERFORMED  I-STAT TROPOININ, ED  Rosezena Sensor, ED    Imaging Review Dg Chest Portable 1 View  09/19/2015  CLINICAL DATA:  54 year old male status post intubation. EXAM: PORTABLE CHEST 1 VIEW COMPARISON:  Radiograph dated 10/30/2013 FINDINGS: Endotracheal tube with tip approximately 4.5 cm above the carina. The diffuse bilateral interstitial prominence. Patchy area of ground-glass airspace opacity noted in the right mid and lower lung field, increased from prior study. No pleural effusion. The left costophrenic  angle has been excluded from the image. No pneumothorax. The cardiac silhouette is within normal limits. Multiple old right rib fractures. IMPRESSION: Endotracheal tube above the carina. Patchy right lung ground-glass opacity, increased from prior study. Electronically Signed   By: Elgie Collard M.D.   On: 09/19/2015 02:50   I have personally reviewed and evaluated these images and lab results as part of my medical decision-making.   EKG Interpretation   Date/Time:  Wednesday September 19 2015 03:15:35 EST Ventricular Rate:  111 PR Interval:  177 QRS Duration: 85 QT Interval:  385 QTC Calculation: 523 R Axis:   102 Text Interpretation:  Sinus tachycardia Left atrial enlargement Right axis  deviation Anteroseptal infarct, old Abnrm T, consider ischemia,  anterolateral lds Prolonged QT interval afib resolved Confirmed by Erroll Luna [id number]) on 09/19/2015 4:10:17 AM      MDM   Final diagnoses:  None   Patient presents to the emergency department for severe shortness of breath and tachycardia. Upon arrival to the emergency department he was critically ill. With a nonrebreather he has 75%. He continued to be diaphoretic, tachypneic, and fading in and out of consciousness. Patient was immediately intubated upon arrival.  Oxygen saturations rose to 100%. He continued to be in a tachycardia. EKG to me appears to be possible A. fib with WPW. Patient's heart rate is in the 200s, with rapidly changing QRS waves. Paramedics had given the patient adenosine, metoprolol, diltiazem all of which did not work. I tried procainamide 2 which did not work. I spoke with Dr. Tresa Endo with cardiology who recommended to shock the patient. This works at [employer] on the first attempt. Repeat EKG reveals normal sinus rhythm with some ST depressions and T-wave inversions in the leads V4 through V6. Cardiology recommended for ICU admission. Patient was covered with a dose of broad-spectrum antibiotics however I  doubt sepsis. This is likely a primary cardiac event causing fluid overload, pulmonary edema and respiratory failure. Patient was initially on a nitroglycerin drip for the fluid overload. I spoke with Dr. Hosie Poisson with the intensivist who accepts the patient for admission.   I personally performed the services described in this documentation, which was scribed in my presence. The recorded information has been reviewed and is accurate.     Tomasita Crumble, MD 09/19/15 [phone]

## 2015-09-19 NOTE — Progress Notes (Signed)
ANTICOAGULATION CONSULT NOTE   Pharmacy Consult for heparin Indication: atrial fibrillation  No Known Allergies  Patient Measurements: Height: 6' (182.9 cm) Weight: 258 lb 9.6 oz (117.3 kg) IBW/kg (Calculated) : 77.6 Heparin Dosing Weight: 105kg  Vital Signs: Temp: 97.6 F (36.4 C) (02/08 1900) Temp Source: Oral (02/08 1900) BP: 133/85 mmHg (02/08 1215) Pulse Rate: 77 (02/08 1800)  Labs:  Recent Labs  09/19/15 0240 09/19/15 0319 09/19/15 0820 09/19/15 1236 09/19/15 1431 09/19/15 2001  HGB 15.7 18.4*  --   --   --   --   HCT 50.0 54.0*  --   --   --   --   PLT 283  --   --   --   --   --   HEPARINUNFRC  --   --   --  0.37  --  0.35  CREATININE 1.41* 1.10  --   --   --   --   TROPONINI  --   --  0.10*  --  0.11*  --     Estimated Creatinine Clearance: 103.9 mL/min (by C-G formula based on Cr of 1.1).    Assessment: 55yo male c/o CP and SOB, found to be in Afib s/p successful DCCV in ED. He continues on heparin.and at goal on 1500 units/hr. Plans for NOAC when more stable (extubated today).  Goal of Therapy:  Heparin level 0.3-0.7 units/ml Monitor platelets by anticoagulation protocol: Yes   Plan:  -No heparin changes needed -Heparin level and CBC daily -Will follow oral anticoagulation plans  Isaac Bliss, PharmD, BCPS, [facility] Clinical Pharmacist Pager [phone] 09/19/2015 8:33 PM

## 2015-09-19 NOTE — Consult Note (Signed)
Reason for Consult: atrial fibrillation with RVR Primary Cardiologist: new Referring Physician: Dr. Hessie Dibble Marvin Estrada is an 55 y.o. male.  HPI: Marvin Estrada is a 55 yo man with PMH of hypertension and T2DM who has had some dyspnea for 1-2 days who woke up acutely tonight and his daughter brought called 911. He was found to be in atrial fibrillation with RVR and he was given adenosine 6 mg, 12 mg and then two rounds of diltiazem 10 mg IV without improvement. He was intubated for respiratory failure and he received procainamide for his atrial fibrillation given concern for possible atrial fibrillation with accessory pathway (WPW). He ultimately was converted with DCCV and now is intubated and relatively hemodynamically stable on the ventilator. He was accompanied by his daughter and wife (currently separated). 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 He hasn't been complaining of any chest pain per the family. He went to his daughter's game (? Basketball) this evening without difficulty. No known sick contacts or symptoms of fever/chills/nausea/vomiting/diarrhea. He's about a 1/2 ppd smoker for 20+ years.  He had blood cultures drawn and vancomycin/zosyn started in the ER.   Past Medical History  Diagnosis Date  . Hypertension   . Diabetes mellitus without complication ([place])   . Kidney stones     History reviewed. No pertinent past surgical history.  Family History  Problem Relation Age of Onset  . Hypertension Mother   . Hypertension Father   . Prostate cancer Father     Social History:  reports that he has been smoking Cigarettes.  He has been smoking about 1.00 pack per day. He does not have any smokeless tobacco history on file. He reports that he drinks alcohol. He reports that he does not use illicit drugs.  Allergies: No Known Allergies  Medications: I have reviewed the patient's current medications. Prior to Admission:  (Not in a  hospital admission) Scheduled:   Results for orders placed or performed during the hospital encounter of 09/19/15 (from the past 48 hour(s))  Comprehensive metabolic panel     Status: Abnormal   Collection Time: 09/19/15  2:40 AM  Result Value Ref Range   Sodium 141 135 - 145 mmol/L   Potassium 3.6 3.5 - 5.1 mmol/L   Chloride 105 101 - 111 mmol/L   CO2 20 (L) 22 - 32 mmol/L   Glucose, Bld 288 (H) 65 - 99 mg/dL   BUN 11 6 - 20 mg/dL   Creatinine, Ser 1.41 (H) 0.61 - 1.24 mg/dL   Calcium 8.7 (L) 8.9 - 10.3 mg/dL   Total Protein 6.3 (L) 6.5 - 8.1 g/dL   Albumin 3.5 3.5 - 5.0 g/dL   AST 40 15 - 41 U/L   ALT 28 17 - 63 U/L   Alkaline Phosphatase 40 38 - 126 U/L   Total Bilirubin 0.9 0.3 - 1.2 mg/dL   GFR calc non Af Amer 56 (L) >60 mL/min   GFR calc Af Amer >60 >60 mL/min    Comment: (NOTE) The eGFR has been calculated using the CKD EPI equation. This calculation has not been validated in all clinical situations. eGFR's persistently <60 mL/min signify possible Chronic Kidney Disease.    Anion gap 16 (H) 5 - 15  CBC with Differential/Platelet     Status: Abnormal (Preliminary result)   Collection Time: 09/19/15  2:40 AM  Result Value Ref Range   WBC 15.2 (H) 4.0 - 10.5 K/uL  RBC 5.49 4.22 - 5.81 MIL/uL   Hemoglobin 15.7 13.0 - 17.0 g/dL   HCT 50.0 39.0 - 52.0 %   MCV 91.1 78.0 - 100.0 fL   MCH 28.6 26.0 - 34.0 pg   MCHC 31.4 30.0 - 36.0 g/dL   RDW 13.3 11.5 - 15.5 %   Platelets 283 150 - 400 K/uL   Neutrophils Relative % PENDING %   Neutro Abs PENDING 1.7 - 7.7 K/uL   Band Neutrophils PENDING %   Lymphocytes Relative PENDING %   Lymphs Abs PENDING 0.7 - 4.0 K/uL   Monocytes Relative PENDING %   Monocytes Absolute PENDING 0.1 - 1.0 K/uL   Eosinophils Relative PENDING %   Eosinophils Absolute PENDING 0.0 - 0.7 K/uL   Basophils Relative PENDING %   Basophils Absolute PENDING 0.0 - 0.1 K/uL   WBC Morphology PENDING    RBC Morphology PENDING    Smear Review PENDING     nRBC PENDING 0 /100 WBC   Metamyelocytes Relative PENDING %   Myelocytes PENDING %   Promyelocytes Absolute PENDING %   Blasts PENDING %  Lipase, blood     Status: None   Collection Time: 09/19/15  2:40 AM  Result Value Ref Range   Lipase 32 11 - 51 U/L  Ethanol     Status: None   Collection Time: 09/19/15  2:40 AM  Result Value Ref Range   Alcohol, Ethyl (B) <5 <5 mg/dL    Comment:        LOWEST DETECTABLE LIMIT FOR SERUM ALCOHOL IS 5 mg/dL FOR MEDICAL PURPOSES ONLY   Magnesium     Status: None   Collection Time: 09/19/15  2:40 AM  Result Value Ref Range   Magnesium 2.3 1.7 - 2.4 mg/dL  I-Stat arterial blood gas, ED     Status: Abnormal   Collection Time: 09/19/15  2:53 AM  Result Value Ref Range   pH, Arterial 7.096 (LL) 7.350 - 7.450   pCO2 arterial 67.7 (HH) 35.0 - 45.0 mmHg   pO2, Arterial 111.0 (H) 80.0 - 100.0 mmHg   Bicarbonate 20.8 20.0 - 24.0 mEq/L   TCO2 23 0 - 100 mmol/L   O2 Saturation 96.0 %   Acid-base deficit 10.0 (H) 0.0 - 2.0 mmol/L   Patient temperature 98.6 F    Collection site RADIAL, ALLEN'S TEST ACCEPTABLE    Drawn by RT    Sample type ARTERIAL    Comment NOTIFIED PHYSICIAN   I-stat troponin, ED (not at [facility], [facility])     Status: None   Collection Time: 09/19/15  3:15 AM  Result Value Ref Range   Troponin i, poc 0.07 0.00 - 0.08 ng/mL   Comment 3            Comment: Due to the release kinetics of cTnI, a negative result within the first hours of the onset of symptoms does not rule out myocardial infarction with certainty. If myocardial infarction is still suspected, repeat the test at appropriate intervals.   I-stat chem 8, ed     Status: Abnormal   Collection Time: 09/19/15  3:19 AM  Result Value Ref Range   Sodium 141 135 - 145 mmol/L   Potassium 3.7 3.5 - 5.1 mmol/L   Chloride 102 101 - 111 mmol/L   BUN 16 6 - 20 mg/dL   Creatinine, Ser 1.10 0.61 - 1.24 mg/dL   Glucose, Bld 280 (H) 65 - 99 mg/dL   Calcium, Ion 1.11 (L) 1.12 -  1.23 mmol/L    TCO2 25 0 - 100 mmol/L   Hemoglobin 18.4 (H) 13.0 - 17.0 g/dL   HCT 54.0 (H) 39.0 - 52.0 %  I-Stat CG4 Lactic Acid, ED     Status: Abnormal   Collection Time: 09/19/15  3:20 AM  Result Value Ref Range   Lactic Acid, Venous 5.26 (HH) 0.5 - 2.0 mmol/L   Comment NOTIFIED PHYSICIAN     Dg Chest Portable 1 View  09/19/2015  CLINICAL DATA:  55 year old male status post intubation. EXAM: PORTABLE CHEST 1 VIEW COMPARISON:  Radiograph dated 10/30/2013 FINDINGS: Endotracheal tube with tip approximately 4.5 cm above the carina. The diffuse bilateral interstitial prominence. Patchy area of ground-glass airspace opacity noted in the right mid and lower lung field, increased from prior study. No pleural effusion. The left costophrenic angle has been excluded from the image. No pneumothorax. The cardiac silhouette is within normal limits. Multiple old right rib fractures. IMPRESSION: Endotracheal tube above the carina. Patchy right lung ground-glass opacity, increased from prior study. Electronically Signed   By: Anner Crete M.D.   On: 09/19/2015 02:50    ROS Blood pressure 115/80, pulse 63, temperature 99.5 F (37.5 C), temperature source Core (Comment), resp. rate 26, SpO2 98 %. Physical Exam Labs reviewed above; many pending abg 7.09/66/111 Na 141, K 3.7, bun/ccr 16/1.1, glucose 280, lactate 5, h/h 18/54, troponin 0.07 EKG and strips reviewed; atrial fibrillation with RVR Converted EKG: sinus rhythm, anterior infarct, age indeterminate, PR interval 175, ST depression Chest x-ray: appears to have increased vascularity diffusely   Assessment/Plan: Marvin Estrada is a 54 yo man with HTN and T2DM who presented with dyspnea and found to have atrial fibrillation with RVR requiring DCCV and mechanical ventilation. Differential for critical illness is broad including ischemia, infection/PNA, heart failure, triggers of atrial fibrillation, COPD among many other etiologies. For now, start heparin for atrial  fibrillation and agree with continued broad spectrum antibiotics. Appreciate critical care medicine team. Will trend cardiac biomarkers, obtain echocardiogram and evaluate for cardiovascular risk factors.  Problem List Acute respiratory failure Atrial fibrillation with RVR Tobacco abuse Hypertension Hyperglycemia with T2DM Elevated troponin Elevated hemoglobin/hematocrit  Plan: 1. Admit to ICU, telemetry, trend cardiac biomarkers 2. Appreciate Critical care team 3. Stop procainamide, start heparin gtt for atrial fibrillation 4. Aspirin 324 mg and 81 mg daily for now 5. TSH, hba1c, BNP, lipid panel 6. Echocardiogram this AM 7. Smoking cessation counseling when able 8. Agree with broad spectrum antibiotics with vancomycin/zosyn 9. Ischemic evaluation based on findings of troponin, echocardiogram further clinical course Marvin Estrada 09/19/2015, 3:59 AM

## 2015-09-19 NOTE — Progress Notes (Signed)
ANTICOAGULATION CONSULT NOTE - Initial Consult  Pharmacy Consult for heparin Indication: atrial fibrillation  No Known Allergies  Patient Measurements: Height: 5' 10.08" (178 cm) Weight: 286 lb 9.6 oz (130 kg) IBW/kg (Calculated) : 73.18 Heparin Dosing Weight: 105kg  Vital Signs: Temp: 99.5 F (37.5 C) (02/08 0310) Temp Source: Core (Comment) (02/08 0249) BP: 115/80 mmHg (02/08 0310) Pulse Rate: 63 (02/08 0310)  Labs:  Recent Labs  09/19/15 0240 09/19/15 0319  HGB 15.7 18.4*  HCT 50.0 54.0*  PLT 283  --   CREATININE 1.41* 1.10    Estimated Creatinine Clearance: 106.6 mL/min (by C-G formula based on Cr of 1.1).   Medical History: Past Medical History  Diagnosis Date  . Hypertension   . Diabetes mellitus without complication (HCC)   . Kidney stones      Assessment: 55yo male c/o CP and SOB, found to be in Afib, to begin heparin.  Goal of Therapy:  Heparin level 0.3-0.7 units/ml Monitor platelets by anticoagulation protocol: Yes   Plan:  Will begin heparin gtt at 1500 units/hr (no bolus per MD request) and monitor heparin levels and CBC.  Vernard Gambles, PharmD, BCPS  09/19/2015,4:21 AM

## 2015-09-19 NOTE — Progress Notes (Signed)
Subjective:  He denies any chest pain or SOB this morning. Reports he had SOB for 5 days prior to admission with an associated productive cough with yellow/green mucus. Denies any fevers. He wants the tube out. Off levophed.   Objective:  Vital Signs in the last 24 hours: Temp:  [97.9 F (36.6 C)-99.5 F (37.5 C)] 97.9 F (36.6 C) (02/08 0530) Pulse Rate:  [36-115] 86 (02/08 0606) Resp:  [18-28] 26 (02/08 0606) BP: (75-150)/(0-117) 93/63 mmHg (02/08 0604) SpO2:  [96 %-100 %] 99 % (02/08 0606) Arterial Line BP: (90)/(62) 90/62 mmHg (02/08 0530) FiO2 (%):  [60 %-100 %] 60 % (02/08 0423) Weight:  [117.3 kg (258 lb 9.6 oz)-130 kg (286 lb 9.6 oz)] 117.3 kg (258 lb 9.6 oz) (02/08 0600)  Intake/Output from previous day: 02/07 0701 - 02/08 0700 In: -  Out: 225 [Urine:225]  Physical Exam: General: Middle aged man sitting up in bed, alert on vent, follows commands HEENT: [place]/AT, EOMI, PERRL, ETT in place Neck: supple, no JVD Lungs: CTA bilaterally CV: RRR, no m/g/r Abd: BS+, soft, obese, non-tender Ext: warm, no peripheral edema  Skin: warm/dry no rash   Lab Results:  Recent Labs  09/19/15 0240 09/19/15 0319  WBC 15.2*  --   HGB 15.7 18.4*  PLT 283  --     Recent Labs  09/19/15 0240 09/19/15 0319  NA 141 141  K 3.6 3.7  CL 105 102  CO2 20*  --   GLUCOSE 288* 280*  BUN 11 16  CREATININE 1.41* 1.10    Cardiac Studies: EKG- T wave inversions in lateral leads, new from EKG in March 2015  Tele: AFib with RVR>> now NSR  Assessment/Plan:  Mr. Stacey is a 55yo man with PMHx of HTN, type 2 DM, and tobacco abuse who presented with dyspnea found to be in AFib with RVR.   New Onset AFib with RVR: Currently in NSR after DCCV.  Unclear etiology for his AFib. He does report a 5 day history of dyspnea with associated productive cough, but he denies fevers and has been afebrile here, CXR not impressive for PNA. Blood cx are pending. Ischemia is a possibility given he did  have some new T wave inversions on his admission EKG and today's EKG compared to an old ones. His BNP was elevated at 770 on admission and CXR with pulm edema, but no evidence of volume overload on exam. His TSH was normal. Will check an echocardiogram to evaluate for valvular disease and his LV function. He may possibly need further ischemic work up as well. His CHADS-VASc score is 2 so he would be a good candidate for anticoagulation. Would recommend a DOAC (Eliquis) once ready to get off heparin gtt.  - f/u Echo - Continue heparin gtt, switch to DOAC when able - Would continue antibiotics for now, but consider switching to CAP coverage  - f/u blood cx - trend trops  AKI: Cr 1.4 on admission, BL 1.0. Likely due to decreased perfusion while he was in AFib with RVR.  - bmet in AM  HTN: Off Levophed. BPs stable in 90s-100s systolic. Appears he is on chlorthalidone at home.  - Hold BP meds - Received lasix 40 IV x 1 for pulm edema  Type 2 DM: No prior A1c in system, A1c pending. Does not appear to be on medications for his diabetes, likely diet controlled.  - f/u A1c - Continue ISS  Tobacco Abuse: Current 1 pack per day smoker.  Will discuss smoking cessation.   Attending note to follow.   Rich Number, MD, MPH Internal Medicine Resident, PGY-II Pager: [phone] 09/19/2015, 7:47 AM  Patient seen, examined. Available data reviewed. Agree with findings, assessment, and plan as outlined by Rich Number, MD. The patient is independently interviewed and examined. He is awake and remarkably alert on the ventilator right now. He is following all commands. Heart is regular rate and rhythm.Lungs are clear. There is no peripheral edema. Chest x-ray shows left lower lobe consolidation. The patient may have had initial symptoms of pneumonia complicated by atrial fibrillation with RVR and diastolic heart failure. We are awaiting a 2-D echocardiogram today. He is converted back to sinus rhythm and I recommend  keeping him on IV heparin until a decision is made about further ischemic evaluation. Will follow along with the CCM team and I suspect he will be extubated in the next 24 hours. Overall he is stable this morning.  Tonny Bollman, M.D. 09/19/2015 11:36 AM

## 2015-09-19 NOTE — Progress Notes (Signed)
Pharmacy Antibiotic Note  Marvin Estrada is a 55 y.o. male admitted on 09/19/2015 with chest pain, afib.  Pt required intubation in the ED. Pharmacy has been consulted for Vancomycin and Zosyn dosing. Pt received Vanc 1.5mg  and Zosyn 3.375gm IV in ED ~0430.  Plan: Zosyn 3.375gm IV q8h - doses over 4 hours Vancomycin 1250mg  IV q12h Will f/u micro data, renal function, and pt's clinical condition Vanc trough prn   Height: 5' 10.08" (178 cm) Weight: 286 lb 9.6 oz (130 kg) IBW/kg (Calculated) : 73.18  Temp (24hrs), Avg:98.1 F (36.7 C), Min:97.9 F (36.6 C), Max:99.5 F (37.5 C)   Recent Labs Lab 09/19/15 0240 09/19/15 0319 09/19/15 0320  WBC 15.2*  --   --   CREATININE 1.41* 1.10  --   LATICACIDVEN  --   --  5.26*    Estimated Creatinine Clearance: 106.6 mL/min (by C-G formula based on Cr of 1.1).    No Known Allergies  Antimicrobials this admission: Vanc 2/8 >>  Zosyn 2/8 >>   Dose adjustments this admission: n/a  Microbiology results: 2/8 BCx:  2/8 Sputum:   Thank you for allowing pharmacy to be a part of this patient's care.  Christoper Fabian, PharmD, BCPS Clinical pharmacist, pager [phone] 09/19/2015 4:50 AM

## 2015-09-19 NOTE — Progress Notes (Signed)
150 of Fentanyl wasted in sink with Bronson Curb, RN

## 2015-09-19 NOTE — Progress Notes (Signed)
   09/19/15 0300  Clinical Encounter Type  Visited With Family;Health care provider  Visit Type ED;Critical Care;Initial  Referral From Nurse  Spiritual Encounters  Spiritual Needs Emotional  Stress Factors  Family Stress Factors Major life changes;Health changes  [facility] called to ED to meet with family in consult room B; daughter and wife (separated from pt) present for MD discussion on pt condition; CH offered emotional support and hospitality ministry and liaison with staff to arrange for bedside visit and follow-up to ICU and support as needed.  Erline Levine

## 2015-09-19 NOTE — H&P (Signed)
PULMONARY / CRITICAL CARE MEDICINE   Name: Marvin Estrada MRN: [id number] DOB: 12/06/1960    ADMISSION DATE:  09/19/2015 CONSULTATION DATE:  09/19/15  REFERRING MD:  EDP  CHIEF COMPLAINT:  SOB  HISTORY OF PRESENT ILLNESS:  Pt is encephelopathic; therefore, this HPI is obtained from chart review. Marvin Estrada is a 55 y.o. male with PMH as outlined below. He woke up from sleep early AM hours 02/08 with worsened SOB.  He had began experiencing symptoms for 1 - 2 days ago, but upon wakening felt much worse.  Daughter called EMS who found pt to be in A.fib with RVR.  He was given adenosine (6mg  and 12mg ) as well as 2 rounds of 10mg  diltiazem in the field without any response.  In ED, he was hypoxic despite NRB, diaphoretic, tachypneic and was apparently fading in and out of consciousness.  He was subsequently intubated.  He then received a dose of procainamide for concern of A.fib with WPW.  Cardiology was then called and pt was ultimately cardioverted to NSR.  PCCM was called for admission.  PAST MEDICAL HISTORY :  He  has a past medical history of Hypertension; Diabetes mellitus without complication (HCC); and Kidney stones.  PAST SURGICAL HISTORY: He  has no past surgical history on file.  No Known Allergies  No current facility-administered medications on file prior to encounter.   Current Outpatient Prescriptions on File Prior to Encounter  Medication Sig  . acetaminophen (TYLENOL) 500 MG tablet Take 1,000 mg by mouth every 6 (six) hours as needed for moderate pain.  . fluticasone (FLONASE) 50 MCG/ACT nasal spray Place 1 spray into both nostrils as needed for allergies or rhinitis.  Marland Kitchen omeprazole (PRILOSEC) 20 MG capsule Take 20 mg by mouth as needed (FOR HEARTBURN).    FAMILY HISTORY:  His indicated that his mother is deceased. He indicated that his father is deceased. He indicated that his sister is alive.   SOCIAL HISTORY: He  reports that he has been smoking Cigarettes.  He  has been smoking about 1.00 pack per day. He does not have any smokeless tobacco history on file. He reports that he drinks alcohol. He reports that he does not use illicit drugs.  REVIEW OF SYSTEMS:   Unable to obtain as pt is encephalopathic.  SUBJECTIVE:  On vent, mildly hypotensive after fentanyl bolus.  VITAL SIGNS: BP 86/67 mmHg  Pulse 80  Temp(Src) 97.9 F (36.6 C) (Core (Comment))  Resp 26  Ht 5' 10.08" (1.78 m)  Wt 130 kg (286 lb 9.6 oz)  BMI 41.03 kg/m2  SpO2 100%  HEMODYNAMICS:    VENTILATOR SETTINGS: Vent Mode:  [-]  FiO2 (%):  [60 %-100 %] 60 %  INTAKE / OUTPUT:     PHYSICAL EXAMINATION: General: Adult male, in NAD. Neuro: Sedated, non-responsive. HEENT: [place]/AT. PERRL, sclerae anicteric. Cardiovascular: RRR, no M/R/G.  Lungs: Respirations even and unlabored.  CTA bilaterally, No W/R/R. Abdomen: BS x 4, soft, NT/ND.  Musculoskeletal: No gross deformities, no edema.  Skin: Intact, warm, no rashes.  LABS:  BMET  Recent Labs Lab 09/19/15 0240 09/19/15 0319  NA 141 141  K 3.6 3.7  CL 105 102  CO2 20*  --   BUN 11 16  CREATININE 1.41* 1.10  GLUCOSE 288* 280*    Electrolytes  Recent Labs Lab 09/19/15 0240  CALCIUM 8.7*  MG 2.3    CBC  Recent Labs Lab 09/19/15 0240 09/19/15 0319  WBC 15.2*  --  HGB 15.7 18.4*  HCT 50.0 54.0*  PLT 283  --     Coag's No results for input(s): APTT, INR in the last 168 hours.  Sepsis Markers  Recent Labs Lab 09/19/15 0320  LATICACIDVEN 5.26*    ABG  Recent Labs Lab 09/19/15 0253 09/19/15 0414  PHART 7.096* 7.309*  PCO2ART 67.7* 40.3  PO2ART 111.0* 109.0*    Liver Enzymes  Recent Labs Lab 09/19/15 0240  AST 40  ALT 28  ALKPHOS 40  BILITOT 0.9  ALBUMIN 3.5    Cardiac Enzymes No results for input(s): TROPONINI, PROBNP in the last 168 hours.  Glucose No results for input(s): GLUCAP in the last 168 hours.  Imaging Dg Chest Portable 1 View  09/19/2015  CLINICAL DATA:   55 year old male status post intubation. EXAM: PORTABLE CHEST 1 VIEW COMPARISON:  Radiograph dated 10/30/2013 FINDINGS: Endotracheal tube with tip approximately 4.5 cm above the carina. The diffuse bilateral interstitial prominence. Patchy area of ground-glass airspace opacity noted in the right mid and lower lung field, increased from prior study. No pleural effusion. The left costophrenic angle has been excluded from the image. No pneumothorax. The cardiac silhouette is within normal limits. Multiple old right rib fractures. IMPRESSION: Endotracheal tube above the carina. Patchy right lung ground-glass opacity, increased from prior study. Electronically Signed   By: Elgie Collard M.D.   On: 09/19/2015 02:50     STUDIES:  CXR 02/08 >mild congestion.  CULTURES: Blood 02/08 > Sputum 02/08 > Urine 02/08 >  ANTIBIOTICS: Vanc 02/08 > Zosyn 02/08 >  SIGNIFICANT EVENTS: 02/08 > admitted with A.fib RVR requiring DCCV, intubation for acute hypoxic respiratory failure.  LINES/TUBES: ETT 02/08 >   ASSESSMENT / PLAN:  PULMONARY A: Acute hypoxic respiratory failure - likely primarily due to A.fib with RVR. Doubt PNA. Mild pulmonary edema. Tobacco use disorder. P:   Full vent support. Wean as able. VAP prevention measures. SBT in AM if able. Levalbuterol PRN. Empiric abx for now given leukocytosis. Assess PCT - if low, consider d/c abx. CXR in AM. Tobacco cessation counseling.  CARDIOVASCULAR A:  A.fib with RVR - required DCCV in ED. 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 Hx HTN. P:  Cardiology following. Continue heparin gtt. Follow TSH, BNP, Hgb A1c, troponin, lactate, echo. Empiric diuresis am 2/8  RENAL A:   Hypocalcemia. P:   1g Ca gluconate. NS @ 100. BMP in AM.  GASTROINTESTINAL A:   Obesity. GI prophylaxis. Nutrition. P:  SUP: Pantoprazole. NPO.  HEMATOLOGIC A:   VTE Prophylaxis. P:  SCD's / heparin gtt. CBC in AM.  INFECTIOUS A:    Leukocytosis + SOB - doubt PNA though. P:   Abx as above (Vanc / Zosyn).  Follow cultures as above. Assess PCT - if low, consider d/c empiric abx.  ENDOCRINE A:   DM.   P:   SSI. Follow TSH.  NEUROLOGIC A:   Acute metabolic encephalopathy - due to sedation. P:   Sedation:  Fentanyl gtt / Midazolam PRN. RASS goal: 0 to -1. Daily WUA.   Family updated: None.  Interdisciplinary Family Meeting v Palliative Care Meeting:  Due by: 02/14.  CC time:  35 minutes.   Rutherford Guys, [place] [facility] Pager: [phone]  or [phone] 09/19/2015, 4:50 AM  Attending Note:  I have examined patient, reviewed labs, studies and notes. I have discussed the case with Ihor Dow, and I agree with the  data and plans as amended above. 55 yo man, no hx A Fib presented w resp distress in setting A Fib + RVR, pulm edema on CXR. On my eval he is now in NSR following cardioversion. He is tolerating PSV. Awake and alert, comfortable. Suspect that he will tolerate PSV this am. Will empirically diurese, chexk repeat CXR, goal extubation 2/8.  Independent critical care time is 35 minutes.   Levy Pupa, MD, PhD 09/19/2015, 8:42 AM [facility] [phone] or if no answer [phone]

## 2015-09-19 NOTE — ED Notes (Signed)
200J cardioversion, HR 176

## 2015-09-19 NOTE — Significant Event (Signed)
Ready for extubation. Weaned for 5 hours. BP 93/63 mmHg  Pulse 69  Temp(Src) 97.8 F (36.6 C) (Oral)  Resp 16  Ht 6' (1.829 m)  Wt 258 lb 9.6 oz (117.3 kg)  BMI 35.06 kg/m2  SpO2 95% Exam Awake and alert  HSR Converted to sr Lungs clear Warm and dry Neuro intact  I/P Intubated will extubate  Brett Canales Cameryn Schum ACNP [facility] PCCM Pager [phone] till 3 pm If no answer page [phone] 09/19/2015, 12:05 PM

## 2015-09-19 NOTE — ED Notes (Signed)
Cards/KELLY paged to [facility]

## 2015-09-19 NOTE — ED Notes (Signed)
The patient went to the fire station due to chest pain and sob that started yesterday.  Fire Department called EMS.  EMS  placed an IV  and gave 6mg , 12mg  and 12mg .  EMS did give him 20mg  of Cardizem, then after gave him 10mg  of Cardizem and he is now on 5mg  of Cardizem.  EMS said he was not answering any questions due to him not being able to breathe.  Patient was transported here.

## 2015-09-19 NOTE — Procedures (Signed)
Arterial Catheter Insertion Procedure Note RESHAWN GHATTAS [id number] Mar 20, 1961  Procedure: Insertion of Arterial Catheter  Indications: Blood pressure monitoring and Frequent blood sampling  Procedure Details Consent: Unable to obtain consent because of altered level of consciousness. Time Out: Verified patient identification, verified procedure, site/side was marked, verified correct patient position, special equipment/implants available, medications/allergies/relevent history reviewed, required imaging and test results available.  Performed  Maximum sterile technique was used including antiseptics, cap, gloves, gown, hand hygiene, mask and sheet. Skin prep: Chlorhexidine; local anesthetic administered 20 gauge catheter was inserted into left radial artery using the Seldinger technique.  Evaluation Blood flow good; BP tracing good. Complications: No apparent complications.   Gaetano Hawthorne 09/19/2015

## 2015-09-19 NOTE — Care Management Note (Signed)
Case Management Note  Patient Details  Name: Marvin Estrada MRN: [id number] Date of Birth: [birth year]  Subjective/Objective:      Adm w at fib, vent              Action/Plan:lives w fam   Expected Discharge Date:                  Expected Discharge Plan:     In-House Referral:     Discharge planning Services     Post Acute Care Choice:    Choice offered to:     DME Arranged:    DME Agency:     HH Arranged:    HH Agency:     Status of Service:     Medicare Important Message Given:    Date Medicare IM Given:    Medicare IM give by:    Date Additional Medicare IM Given:    Additional Medicare Important Message give by:     If discussed at Long Length of Stay Meetings, dates discussed:    Additional Comments: ur review done  Hanley Hays, RN 09/19/2015, 8:07 AM

## 2015-09-20 ENCOUNTER — Ambulatory Visit (HOSPITAL_COMMUNITY): Payer: BC Managed Care – PPO

## 2015-09-20 ENCOUNTER — Inpatient Hospital Stay (HOSPITAL_COMMUNITY): Payer: BC Managed Care – PPO

## 2015-09-20 DIAGNOSIS — I5041 Acute combined systolic (congestive) and diastolic (congestive) heart failure: Secondary | ICD-10-CM | POA: Diagnosis present

## 2015-09-20 DIAGNOSIS — R079 Chest pain, unspecified: Secondary | ICD-10-CM

## 2015-09-20 DIAGNOSIS — I5021 Acute systolic (congestive) heart failure: Secondary | ICD-10-CM

## 2015-09-20 LAB — BLOOD GAS, ARTERIAL
Acid-base deficit: 1.3 mmol/L (ref 0.0–2.0)
Bicarbonate: 22.7 mEq/L (ref 20.0–24.0)
Drawn by: 347621
FIO2: 0.28
O2 CONTENT: 2 L/min
O2 Saturation: 96.3 %
PH ART: 7.407 (ref 7.350–7.450)
PO2 ART: 86 mmHg (ref 80.0–100.0)
Patient temperature: 98.5
TCO2: 23.8 mmol/L (ref 0–100)
pCO2 arterial: 36.7 mmHg (ref 35.0–45.0)

## 2015-09-20 LAB — GLUCOSE, CAPILLARY
GLUCOSE-CAPILLARY: 94 mg/dL (ref 65–99)
GLUCOSE-CAPILLARY: 110 mg/dL — AB (ref 65–99)
GLUCOSE-CAPILLARY: 138 mg/dL — AB (ref 65–99)
Glucose-Capillary: 73 mg/dL (ref 65–99)
Glucose-Capillary: 84 mg/dL (ref 65–99)
Glucose-Capillary: 93 mg/dL (ref 65–99)
Glucose-Capillary: 93 mg/dL (ref 65–99)

## 2015-09-20 LAB — CBC
HCT: 42.8 % (ref 39.0–52.0)
Hemoglobin: 13.7 g/dL (ref 13.0–17.0)
MCH: 28 pg (ref 26.0–34.0)
MCHC: 32 g/dL (ref 30.0–36.0)
MCV: 87.3 fL (ref 78.0–100.0)
PLATELETS: 209 10*3/uL (ref 150–400)
RBC: 4.9 MIL/uL (ref 4.22–5.81)
RDW: 13.5 % (ref 11.5–15.5)
WBC: 7.8 10*3/uL (ref 4.0–10.5)

## 2015-09-20 LAB — HEPARIN LEVEL (UNFRACTIONATED): Heparin Unfractionated: 0.42 IU/mL (ref 0.30–0.70)

## 2015-09-20 LAB — BASIC METABOLIC PANEL
Anion gap: 12 (ref 5–15)
BUN: 8 mg/dL (ref 6–20)
CALCIUM: 8.7 mg/dL — AB (ref 8.9–10.3)
CO2: 23 mmol/L (ref 22–32)
CREATININE: 0.9 mg/dL (ref 0.61–1.24)
Chloride: 106 mmol/L (ref 101–111)
GFR calc Af Amer: >60 mL/min (ref >60)
GLUCOSE: 128 mg/dL — AB (ref 65–99)
Potassium: 3.4 mmol/L — ABNORMAL LOW (ref 3.5–5.1)
SODIUM: 141 mmol/L (ref 135–145)

## 2015-09-20 LAB — PHOSPHORUS: Phosphorus: 4.1 mg/dL (ref 2.5–4.6)

## 2015-09-20 LAB — MAGNESIUM: MAGNESIUM: 1.8 mg/dL (ref 1.7–2.4)

## 2015-09-20 LAB — HEMOGLOBIN A1C
HEMOGLOBIN A1C: 6 % — AB (ref 4.8–5.6)
MEAN PLASMA GLUCOSE: 126 mg/dL

## 2015-09-20 MED ORDER — PERFLUTREN LIPID MICROSPHERE
INTRAVENOUS | Status: AC
  Filled 2015-09-20: qty 10

## 2015-09-20 MED ORDER — PERFLUTREN LIPID MICROSPHERE
1.0000 mL | INTRAVENOUS | Status: AC | PRN
  Administered 2015-09-20: 3 mL via INTRAVENOUS
  Filled 2015-09-20: qty 10

## 2015-09-20 MED ORDER — PANTOPRAZOLE SODIUM 40 MG PO TBEC
40.0000 mg | DELAYED_RELEASE_TABLET | Freq: Every day | ORAL | Status: DC
  Administered 2015-09-20 – 2015-09-22 (×2): 40 mg via ORAL
  Filled 2015-09-20 (×2): qty 1

## 2015-09-20 MED ORDER — OFF THE BEAT BOOK
Freq: Once | Status: AC
  Administered 2015-09-20: 17:00:00
  Filled 2015-09-20: qty 1

## 2015-09-20 MED ORDER — LEVOFLOXACIN 750 MG PO TABS
750.0000 mg | ORAL_TABLET | Freq: Every day | ORAL | Status: DC
  Administered 2015-09-20 – 2015-09-22 (×3): 750 mg via ORAL
  Filled 2015-09-20 (×3): qty 1

## 2015-09-20 MED ORDER — FUROSEMIDE 10 MG/ML IJ SOLN
80.0000 mg | Freq: Two times a day (BID) | INTRAMUSCULAR | Status: DC
  Administered 2015-09-20 (×2): 80 mg via INTRAVENOUS
  Filled 2015-09-20 (×2): qty 8

## 2015-09-20 NOTE — Progress Notes (Signed)
Attempt to call report. Nurse currently receiving report on another pt.

## 2015-09-20 NOTE — Progress Notes (Signed)
CRITICAL VALUE ALERT  Critical value received:  Positive Blood cultures  Date of notification:  09/20/15  Time of notification:  0430  Critical value read back:Yes.    Nurse who received alert:  Alycia Rossetti   MD notified (1st page):  E-Link MD  Time of first page:  0445    Responding MD:  E-Link MD  Time MD responded: [phone]

## 2015-09-20 NOTE — Progress Notes (Signed)
PULMONARY / CRITICAL CARE MEDICINE   Name: Marvin Estrada MRN: [id number] DOB: 04-25-1961    ADMISSION DATE:  09/19/2015 CONSULTATION DATE:  09/19/15  REFERRING MD:  EDP  CHIEF COMPLAINT:  SOB  HISTORY OF PRESENT ILLNESS:  Pt is encephelopathic; therefore, this HPI is obtained from chart review. Marvin Estrada is a 55 y.o. male with PMH as outlined below. He woke up from sleep early AM hours 02/08 with worsened SOB.  He had began experiencing symptoms for 1 - 2 days ago, but upon wakening felt much worse.  Daughter called EMS who found pt to be in A.fib with RVR.  He was given adenosine (6mg  and 12mg ) as well as 2 rounds of 10mg  diltiazem in the field without any response.  In ED, he was hypoxic despite NRB, diaphoretic, tachypneic and was apparently fading in and out of consciousness.  He was subsequently intubated.  He then received a dose of procainamide for concern of A.fib with WPW.  Cardiology was then called and pt was ultimately cardioverted to NSR.  PCCM was called for admission.   SUBJECTIVE:  Has had some dyspnea as well as some cough with sputum production.  Tolerated extubation Net negative I/O last 24h  VITAL SIGNS: BP 133/85 mmHg  Pulse 70  Temp(Src) 98.2 F (36.8 C) (Oral)  Resp 20  Ht 6' (1.829 m)  Wt 116.5 kg (256 lb 13.4 oz)  BMI 34.83 kg/m2  SpO2 98%  HEMODYNAMICS:    VENTILATOR SETTINGS:    INTAKE / OUTPUT: I/O last 3 completed shifts: In: 1700.1 [P.O.:480; I.V.:620.1; IV Piggyback:600] Out: 3100 [Urine:3000; Emesis/NG output:100]   PHYSICAL EXAMINATION: General: Adult male, in NAD on O2 Neuro: Awake, appropriate, non-focal HEENT: [place]/AT. PERRL, sclerae anicteric. Cardiovascular: RRR, no M/R/G.  Lungs: Respirations even and unlabored.  No crackles.  Abdomen: BS x 4, soft, NT/ND.  Musculoskeletal: No gross deformities, no edema.  Skin: Intact, warm, no rashes.  LABS:  BMET  Recent Labs Lab 09/19/15 0240 09/19/15 0319 09/20/15 0437  NA  141 141 141  K 3.6 3.7 3.4*  CL 105 102 106  CO2 20*  --  23  BUN 11 16 8   CREATININE 1.41* 1.10 0.90  GLUCOSE 288* 280* 128*    Electrolytes  Recent Labs Lab 09/19/15 0240 09/20/15 0437  CALCIUM 8.7* 8.7*  MG 2.3 1.8  PHOS  --  4.1    CBC  Recent Labs Lab 09/19/15 0240 09/19/15 0319 09/20/15 0437  WBC 15.2*  --  7.8  HGB 15.7 18.4* 13.7  HCT 50.0 54.0* 42.8  PLT 283  --  209    Coag's No results for input(s): APTT, INR in the last 168 hours.  Sepsis Markers  Recent Labs Lab 09/19/15 0320 09/19/15 0526 09/19/15 0529 09/19/15 0820  LATICACIDVEN 5.26*  --  1.5 1.4  PROCALCITON  --  0.29  --   --     ABG  Recent Labs Lab 09/19/15 0253 09/19/15 0414 09/20/15 0343  PHART 7.096* 7.309* 7.407  PCO2ART 67.7* 40.3 36.7  PO2ART 111.0* 109.0* 86.0    Liver Enzymes  Recent Labs Lab 09/19/15 0240  AST 40  ALT 28  ALKPHOS 40  BILITOT 0.9  ALBUMIN 3.5    Cardiac Enzymes  Recent Labs Lab 09/19/15 0820 09/19/15 1431  TROPONINI 0.10* 0.11*    Glucose  Recent Labs Lab 09/19/15 1222 09/19/15 1655 09/19/15 2025 09/19/15 2318 09/20/15 0335 09/20/15 0745  GLUCAP 128* 81 162* 73 94 93  Imaging Dg Chest Port 1 View  09/20/2015  CLINICAL DATA:  55 year old male with acute respiratory failure. Hypoxia, atrial fibrillation. Initial encounter. EXAM: PORTABLE CHEST 1 VIEW COMPARISON:  09/19/2015 and earlier. FINDINGS: Portable AP semi upright view at 0547 hours. Extubated. Enteric tube removed. Stable lung volumes. Continued veiling bilateral veiling opacity, greater on the right. Dense retrocardiac opacity. No pneumothorax. Overall stable ventilation. Stable cardiac size and mediastinal contours. IMPRESSION: 1. Extubated and enteric tube removed. 2. Stable ventilation with bilateral veiling opacity suspicious for pleural effusions and dense lower lobe collapse or consolidation. Electronically Signed   By: Odessa Fleming M.D.   On: 09/20/2015 07:16      STUDIES:  CXR 02/08 >mild congestion.  CULTURES: Blood 02/08 > Sputum 02/08 > Urine 02/08 >  ANTIBIOTICS: Vanc 02/08 > 2/9 Zosyn 02/08 >2/9 levaquin PO 2/9 >>   SIGNIFICANT EVENTS: 02/08 > admitted with A.fib RVR requiring DCCV, intubation for acute hypoxic respiratory failure.  LINES/TUBES: ETT 02/08 > 2/8    ASSESSMENT / PLAN:  PULMONARY A: Acute hypoxic respiratory failure - likely primarily due to A.fib with RVR, associated cardiogenic edema Doubt PNA but possible, particularly since he required urgent intubation etc Tobacco use disorder. P:   Pulm hygiene Levalbuterol PRN. Adjust abx to PO and complete course for possible CAP Follow CXR Tobacco cessation counseling.  CARDIOVASCULAR A:  A.fib with RVR - required DCCV in ED. 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 Hx HTN. Acute pulm edema P:  Cardiology following. Continue heparin gtt. Suspect he will be started on chronic anti-coag Diuresis per cardiology plans May be going to cath lab for further assessment   RENAL A:   Hypocalcemia. P:   NS KVO BMP in AM.  GASTROINTESTINAL A:   Obesity. GI prophylaxis. Nutrition. P:  SUP: Pantoprazole. PO diet  HEMATOLOGIC A:   VTE Prophylaxis. P:  SCD's / heparin gtt. CBC in AM.  INFECTIOUS A:   Leukocytosis + SOB - doubt PNA though. P:   Abx to PO levaquin, complete 7 days total abx Follow cultures as above.  ENDOCRINE A:   DM.   P:   SSI.  NEUROLOGIC A:   Acute metabolic encephalopathy - due to sedation, resolved P:   Sedation:  off   Family updated: None present 2/9  Interdisciplinary Family Meeting v Palliative Care Meeting:  Due by: 02/14.   Discussed case with Dr Excell Seltzer. Pt will likely get cath, be started on DOAC long term. Agree with covering for 7 days total w abx in case a component of PNA here. Pt to transfer to cardiology service, PCCM will be available if needed.     Levy Pupa, MD, PhD 09/20/2015, 10:06  AM [facility] [phone] or if no answer [phone]

## 2015-09-20 NOTE — Progress Notes (Signed)
Report called to 3W per student nurse. Will call MD for orders to d/c arterial line due to 3W cannot take arterial line.

## 2015-09-20 NOTE — Progress Notes (Signed)
Echocardiogram 2D Echocardiogram with Definity has been performed.  Marvin Estrada 09/20/2015, 12:38 PM

## 2015-09-20 NOTE — Progress Notes (Signed)
[facility] ADULT ICU REPLACEMENT PROTOCOL FOR AM LAB REPLACEMENT ONLY  The patient does not apply for the [facility] Adult ICU Electrolyte Replacment Protocol based on the criteria listed below:    Is urine output >/= 0.5 ml/kg/hr for the last 6 hours? No. Patient's UOP is .05 ml/kg/hr    Abnormal electrolyte(s): K3.4  If a panic level lab has been reported, has the CCM MD in charge been notified? Yes.  .   Physician:  Dr. Elvera Bicker, Charlynn Grimes 09/20/2015 6:34 AM

## 2015-09-20 NOTE — Progress Notes (Signed)
Nurse entered room , patients family asked if pt. has had any medications to make him talk out of his head. Family states that pt. was talking out of his head when they arrived. Patient states that he" started talking funny after that guy put that dye in my arm". Patient is alert and oriented equal grips bilaterally, moved lower extremity purposeful and strong against resistance, smile is symmetric, arms are steady with no drift noted. No slurred or distorted speech 176/93 HR93, RR24 Patients face is flushed, however face has been flushed this am with activity such as using the urinal. Bhaget Pa with cardiology notified and will come to the unit to assess patient. Will continue to monitor pt.

## 2015-09-20 NOTE — Progress Notes (Signed)
Ok to d/c arterial line per Dr. Jens Som. Order entered arterial line d/c pressure held approximately and gauze dressing applied.

## 2015-09-20 NOTE — Progress Notes (Signed)
ANTICOAGULATION CONSULT NOTE   Pharmacy Consult for heparin Indication: atrial fibrillation  No Known Allergies  Patient Measurements: Height: 6' (182.9 cm) Weight: 256 lb 13.4 oz (116.5 kg) IBW/kg (Calculated) : 77.6 Heparin Dosing Weight: 105kg  Vital Signs: Temp: 97.8 F (36.6 C) (02/09 1200) Temp Source: Oral (02/09 1200) Pulse Rate: 70 (02/09 0400)  Labs:  Recent Labs  09/19/15 0240 09/19/15 0319 09/19/15 0820 09/19/15 1236 09/19/15 1431 09/19/15 2001 09/20/15 0437  HGB 15.7 18.4*  --   --   --   --  13.7  HCT 50.0 54.0*  --   --   --   --  42.8  PLT 283  --   --   --   --   --  209  HEPARINUNFRC  --   --   --  0.37  --  0.35 0.42  CREATININE 1.41* 1.10  --   --   --   --  0.90  TROPONINI  --   --  0.10*  --  0.11*  --   --     Estimated Creatinine Clearance: 126.6 mL/min (by C-G formula based on Cr of 0.9).    Assessment: 55yo male c/o CP and SOB, found to be in Afib s/p successful DCCV in ED. He continues on heparin.and at goal on 1500 units/hr. Plans for NOAC when more stable.  Daily HL 0.42 (therapeutic), H/H stable, PLT wnl, no bleeding noted  Goal of Therapy:  Heparin level 0.3-0.7 units/ml Monitor platelets by anticoagulation protocol: Yes   Plan:  -Continue heparin at current rate of 1500 units/hr -Heparin level and CBC daily -Will follow oral anticoagulation plans  Zilda No C. Marvis Moeller, PharmD Pharmacy Resident  Pager: [phone] 09/20/2015 1:25 PM

## 2015-09-20 NOTE — Progress Notes (Signed)
Subjective:  No acute events overnight. He was extubated yesterday and doing well off the vent. He reports feeling SOB this morning. Denies any chest pain. Reports he has been coughing up mucus. He states before hospitalization he was having symptoms of PND and orthopnea.   Blood cx (1 out of 2) positive overnight with GPC in clusters, likely contaminant. He has remained afebrile.   Objective:  Vital Signs in the last 24 hours: Temp:  [97.6 F (36.4 C)-98.6 F (37 C)] 98.5 F (36.9 C) (02/09 0300) Pulse Rate:  [62-84] 70 (02/09 0400) Resp:  [16-32] 20 (02/09 0400) BP: (100-133)/(71-85) 133/85 mmHg (02/08 1215) SpO2:  [93 %-100 %] 98 % (02/09 0400) Arterial Line BP: (87-145)/(58-97) 134/82 mmHg (02/09 0400) FiO2 (%):  [40 %-60 %] 40 % (02/08 0858) Weight:  [256 lb 13.4 oz (116.5 kg)] 256 lb 13.4 oz (116.5 kg) (02/09 0512)  Intake/Output from previous day: 02/08 0701 - 02/09 0700 In: 1700.1 [P.O.:480; I.V.:620.1; IV Piggyback:600] Out: 2875 [Urine:2775; Emesis/NG output:100]  Physical Exam: General: Middle aged man sitting up in bed, NAD HEENT: [place]/AT, EOMI, PERRL, mucus membranes moist Neck: supple, no JVD Lungs: crackles at bases CV: RRR, no m/g/r Abd: BS+, soft, obese, non-tender Ext: warm, no peripheral edema  Skin: warm/dry no rash   Lab Results:  Recent Labs  09/19/15 0240 09/19/15 0319 09/20/15 0437  WBC 15.2*  --  7.8  HGB 15.7 18.4* 13.7  PLT 283  --  209    Recent Labs  09/19/15 0240 09/19/15 0319 09/20/15 0437  NA 141 141 141  K 3.6 3.7 3.4*  CL 105 102 106  CO2 20*  --  23  GLUCOSE 288* 280* 128*  BUN 11 16 8   CREATININE 1.41* 1.10 0.90    Cardiac Studies: EKG- T wave inversions in lateral leads, new from EKG in March 2015  Tele: Few PVCs  Assessment/Plan:  Marvin Estrada is a 54yo man with PMHx of HTN, type 2 DM, and tobacco abuse who presented with dyspnea found to be in AFib with RVR.   New Onset AFib with RVR: Currently in NSR after  DCCV. CHADS-VASc 2. Seems most likely his AFib with RVR was related to new onset CHF vs ischemia. I doubt he has a PNA given no fevers at home and CXR not impressive. We are awaiting an echocardiogram to evaluate his LV function and assess for any valvular disease. Will keep him on heparin for now as may pursue ischemic work up (possible cath tomorrow). Once ready, he can be switched to a DOAC. Will start diuresing him to hopefully help his SOB.  - Will transfer to SDU - Start Lasix 80 mg IV BID - f/u Echo - Continue heparin gtt, switch to DOAC when able - Discussed antibiotics with Pulm and will switch to Levaquin to complete 7 day course for presumed CAP - f/u blood cx>> aerobic bottle growing gram positive cocci in clusters, likely contaminant  AKI- Resolved: Cr 1.4 on admission, BL 1.0. Likely due to decreased perfusion while he was in AFib with RVR. His Cr is now back to baseline.   HTN: BPs stable in 100s-130s systolic. Appears he is on chlorthalidone at home.  - Hold BP meds as normotensive   Type 2 DM: No prior A1c in system. His A1c came back and is 6.0. Does not appear to be on medications for his diabetes, likely diet controlled. His blood sugars have been well controlled here.  - Continue ISS  Tobacco Abuse: Current 1 pack per day smoker. We discussed the importance of smoking cessation and he states he has quit. He does not want nicotine patches/gum, plans on quitting cold [place].   Attending note to follow.   Marvin Number, MD, MPH Internal Medicine Resident, PGY-II Pager: [phone] 09/20/2015, 7:42 AM  Patient seen, examined. Available data reviewed. Agree with findings, assessment, and plan as outlined by Marvin Number, MD. the patient is now extubated and can provide medical history.  His symptoms sound consistent with congestive heart failure as he developed shortness of breath with exertion, orthopnea, and PND over several days prior to admission.  We are awaiting a 2-D  echocardiogram.  The patient has multiple risk factors for CAD and he has mild troponin elevation with his acute illness.  I think we should consider cardiac catheterization prior to starting him on anticoagulation for atrial fib.  It is possible that atrial fibrillation contributed to his acute heart failure.  We will treat him with IV diuretics today and consider cardiac catheterization tomorrow. Discussed risks and indications of cardiac catheterization and possible PCI with the patient. Will give furosemide 80 mg IV BID today.  Marvin Estrada, M.D. 09/20/2015 10:45 AM

## 2015-09-21 ENCOUNTER — Encounter (HOSPITAL_COMMUNITY): Payer: Self-pay | Admitting: Cardiology

## 2015-09-21 ENCOUNTER — Encounter (HOSPITAL_COMMUNITY)
Admission: EM | Disposition: A | Discharge status: Home / Self Care | Payer: Self-pay | Source: Home / Self Care | Attending: Cardiovascular Disease

## 2015-09-21 DIAGNOSIS — R778 Other specified abnormalities of plasma proteins: Secondary | ICD-10-CM | POA: Diagnosis present

## 2015-09-21 DIAGNOSIS — R7989 Other specified abnormal findings of blood chemistry: Secondary | ICD-10-CM

## 2015-09-21 DIAGNOSIS — I429 Cardiomyopathy, unspecified: Secondary | ICD-10-CM

## 2015-09-21 DIAGNOSIS — I5041 Acute combined systolic (congestive) and diastolic (congestive) heart failure: Secondary | ICD-10-CM

## 2015-09-21 HISTORY — PX: CARDIAC CATHETERIZATION: SHX172

## 2015-09-21 LAB — CULTURE, RESPIRATORY W GRAM STAIN

## 2015-09-21 LAB — MAGNESIUM: MAGNESIUM: 1.8 mg/dL (ref 1.7–2.4)

## 2015-09-21 LAB — CBC
HCT: 48.4 % (ref 39.0–52.0)
Hemoglobin: 16.3 g/dL (ref 13.0–17.0)
MCH: 29.6 pg (ref 26.0–34.0)
MCHC: 33.7 g/dL (ref 30.0–36.0)
MCV: 87.8 fL (ref 78.0–100.0)
Platelets: 181 10*3/uL (ref 150–400)
RBC: 5.51 MIL/uL (ref 4.22–5.81)
RDW: 13.5 % (ref 11.5–15.5)
WBC: 8.2 10*3/uL (ref 4.0–10.5)

## 2015-09-21 LAB — BASIC METABOLIC PANEL
Anion gap: 14 (ref 5–15)
BUN: 8 mg/dL (ref 6–20)
CALCIUM: 8.9 mg/dL (ref 8.9–10.3)
CO2: 26 mmol/L (ref 22–32)
CREATININE: 0.85 mg/dL (ref 0.61–1.24)
Chloride: 102 mmol/L (ref 101–111)
GFR calc non Af Amer: >60 mL/min (ref >60)
Glucose, Bld: 94 mg/dL (ref 65–99)
Potassium: 3 mmol/L — ABNORMAL LOW (ref 3.5–5.1)
SODIUM: 142 mmol/L (ref 135–145)

## 2015-09-21 LAB — GLUCOSE, CAPILLARY
GLUCOSE-CAPILLARY: 99 mg/dL (ref 65–99)
GLUCOSE-CAPILLARY: 92 mg/dL (ref 65–99)
GLUCOSE-CAPILLARY: 146 mg/dL — AB (ref 65–99)
Glucose-Capillary: 97 mg/dL (ref 65–99)
Glucose-Capillary: 126 mg/dL — ABNORMAL HIGH (ref 65–99)
Glucose-Capillary: 112 mg/dL — ABNORMAL HIGH (ref 65–99)

## 2015-09-21 LAB — HEPARIN LEVEL (UNFRACTIONATED): HEPARIN UNFRACTIONATED: 0.21 [IU]/mL — AB (ref 0.30–0.70)

## 2015-09-21 LAB — PHOSPHORUS: PHOSPHORUS: 4 mg/dL (ref 2.5–4.6)

## 2015-09-21 LAB — PROTIME-INR
INR: 1.32 (ref 0.00–1.49)
PROTHROMBIN TIME: 16.6 s — AB (ref 11.6–15.2)

## 2015-09-21 LAB — CULTURE, RESPIRATORY: CULTURE: NORMAL

## 2015-09-21 SURGERY — LEFT HEART CATH AND CORONARY ANGIOGRAPHY
Anesthesia: LOCAL

## 2015-09-21 MED ORDER — SODIUM CHLORIDE 0.9% FLUSH: 3.0000 mL | INTRAVENOUS | Status: DC | PRN

## 2015-09-21 MED ORDER — AMIODARONE HCL IN DEXTROSE 360-4.14 MG/200ML-% IV SOLN
60.0000 mg/h | INTRAVENOUS | Status: AC
  Administered 2015-09-21 (×2): 60 mg/h via INTRAVENOUS
  Filled 2015-09-21 (×2): qty 200

## 2015-09-21 MED ORDER — VERAPAMIL HCL 2.5 MG/ML IV SOLN: INTRAVENOUS | Status: DC | PRN

## 2015-09-21 MED ORDER — AMIODARONE HCL IN DEXTROSE 360-4.14 MG/200ML-% IV SOLN
30.0000 mg/h | INTRAVENOUS | Status: DC
  Administered 2015-09-21 (×2): 30 mg/h via INTRAVENOUS
  Filled 2015-09-21 (×2): qty 200

## 2015-09-21 MED ORDER — SODIUM CHLORIDE 0.9 % IV SOLN: INTRAVENOUS | Status: DC

## 2015-09-21 MED ORDER — POTASSIUM CHLORIDE CRYS ER 20 MEQ PO TBCR
20.0000 meq | EXTENDED_RELEASE_TABLET | Freq: Two times a day (BID) | ORAL | Status: DC
  Administered 2015-09-21 – 2015-09-22 (×3): 20 meq via ORAL
  Filled 2015-09-21 (×3): qty 1

## 2015-09-21 MED ORDER — HEPARIN (PORCINE) IN NACL 2-0.9 UNIT/ML-% IJ SOLN
INTRAMUSCULAR | Status: AC
  Filled 2015-09-21: qty 1000

## 2015-09-21 MED ORDER — HEPARIN SODIUM (PORCINE) 1000 UNIT/ML IJ SOLN
INTRAMUSCULAR | Status: DC | PRN
  Administered 2015-09-21: 5000 [IU] via INTRAVENOUS

## 2015-09-21 MED ORDER — SODIUM CHLORIDE 0.9% FLUSH: 3.0000 mL | Freq: Two times a day (BID) | INTRAVENOUS | Status: DC

## 2015-09-21 MED ORDER — FENTANYL CITRATE (PF) 100 MCG/2ML IJ SOLN
INTRAMUSCULAR | Status: AC
  Filled 2015-09-21: qty 2

## 2015-09-21 MED ORDER — VERAPAMIL HCL 2.5 MG/ML IV SOLN
INTRAVENOUS | Status: AC
  Filled 2015-09-21: qty 2

## 2015-09-21 MED ORDER — SODIUM CHLORIDE 0.9 % WEIGHT BASED INFUSION
1.0000 mL/kg/h | INTRAVENOUS | Status: AC
  Administered 2015-09-21: 1 mL/kg/h via INTRAVENOUS

## 2015-09-21 MED ORDER — VERAPAMIL HCL 2.5 MG/ML IV SOLN
INTRAVENOUS | Status: DC | PRN
  Administered 2015-09-21: 10 mL via INTRA_ARTERIAL

## 2015-09-21 MED ORDER — FUROSEMIDE 40 MG PO TABS
40.0000 mg | ORAL_TABLET | Freq: Two times a day (BID) | ORAL | Status: DC
  Administered 2015-09-22: 40 mg via ORAL
  Filled 2015-09-21 (×2): qty 1

## 2015-09-21 MED ORDER — MIDAZOLAM HCL 2 MG/2ML IJ SOLN
INTRAMUSCULAR | Status: AC
  Filled 2015-09-21: qty 2

## 2015-09-21 MED ORDER — HEPARIN (PORCINE) IN NACL 2-0.9 UNIT/ML-% IJ SOLN
INTRAMUSCULAR | Status: DC | PRN
  Administered 2015-09-21: 1500 mL

## 2015-09-21 MED ORDER — IOHEXOL 350 MG/ML SOLN
INTRAVENOUS | Status: DC | PRN
  Administered 2015-09-21: 85 mL via INTRA_ARTERIAL

## 2015-09-21 MED ORDER — SODIUM CHLORIDE 0.9 % IV SOLN: 250.0000 mL | INTRAVENOUS | Status: DC | PRN

## 2015-09-21 MED ORDER — LIDOCAINE HCL (PF) 1 % IJ SOLN
INTRAMUSCULAR | Status: AC
  Filled 2015-09-21: qty 30

## 2015-09-21 MED ORDER — AMIODARONE LOAD VIA INFUSION
150.0000 mg | Freq: Once | INTRAVENOUS | Status: AC
  Administered 2015-09-21: 150 mg via INTRAVENOUS
  Filled 2015-09-21: qty 83.34

## 2015-09-21 MED ORDER — FENTANYL CITRATE (PF) 100 MCG/2ML IJ SOLN
INTRAMUSCULAR | Status: DC | PRN
  Administered 2015-09-21: 50 ug via INTRAVENOUS

## 2015-09-21 MED ORDER — HEPARIN SODIUM (PORCINE) 1000 UNIT/ML IJ SOLN
INTRAMUSCULAR | Status: AC
  Filled 2015-09-21: qty 1

## 2015-09-21 MED ORDER — MIDAZOLAM HCL 2 MG/2ML IJ SOLN
INTRAMUSCULAR | Status: DC | PRN
  Administered 2015-09-21: 2 mg via INTRAVENOUS

## 2015-09-21 MED ORDER — POTASSIUM CHLORIDE 10 MEQ/100ML IV SOLN
10.0000 meq | INTRAVENOUS | Status: AC
  Administered 2015-09-21 (×2): 10 meq via INTRAVENOUS
  Filled 2015-09-21 (×2): qty 100

## 2015-09-21 MED ORDER — LIDOCAINE HCL (PF) 1 % IJ SOLN
INTRAMUSCULAR | Status: DC | PRN
  Administered 2015-09-21: 3 mL

## 2015-09-21 MED ORDER — RIVAROXABAN 20 MG PO TABS
20.0000 mg | ORAL_TABLET | Freq: Every day | ORAL | Status: DC
  Administered 2015-09-21: 20 mg via ORAL
  Filled 2015-09-21: qty 1

## 2015-09-21 MED ORDER — ASPIRIN 81 MG PO CHEW
81.0000 mg | CHEWABLE_TABLET | ORAL | Status: AC
  Administered 2015-09-21: 81 mg via ORAL

## 2015-09-21 MED ORDER — ASPIRIN 81 MG PO CHEW
81.0000 mg | CHEWABLE_TABLET | ORAL | Status: DC
  Filled 2015-09-21: qty 1

## 2015-09-21 MED ORDER — SODIUM CHLORIDE 0.9 % IV SOLN
INTRAVENOUS | Status: DC
  Administered 2015-09-21: 10 mL/h via INTRAVENOUS

## 2015-09-21 SURGICAL SUPPLY — 10 items
CATH INFINITI 5FR ANG PIGTAIL (CATHETERS) ×2 IMPLANT
CATH OPTITORQUE TIG 4.0 5F (CATHETERS) ×2 IMPLANT
DEVICE RAD COMP TR BAND LRG (VASCULAR PRODUCTS) ×2 IMPLANT
GLIDESHEATH SLEND A-KIT 6F 22G (SHEATH) ×2 IMPLANT
KIT HEART LEFT (KITS) ×2 IMPLANT
PACK CARDIAC CATHETERIZATION (CUSTOM PROCEDURE TRAY) ×2 IMPLANT
SYR MEDRAD MARK V 150ML (SYRINGE) ×2 IMPLANT
TRANSDUCER W/STOPCOCK (MISCELLANEOUS) ×2 IMPLANT
TUBING CIL FLEX 10 FLL-RA (TUBING) ×4 IMPLANT
WIRE SAFE-T 1.5MM-J .035X260CM (WIRE) ×2 IMPLANT

## 2015-09-21 NOTE — Progress Notes (Signed)
ANTICOAGULATION CONSULT NOTE   Pharmacy Consult for heparin Indication: atrial fibrillation  No Known Allergies  Patient Measurements: Height: 6' (182.9 cm) Weight: 244 lb 4.8 oz (110.814 kg) IBW/kg (Calculated) : 77.6 Heparin Dosing Weight: 105kg  Vital Signs: Temp: 98.4 F (36.9 C) (02/10 0436) Temp Source: Oral (02/10 0436) BP: 129/85 mmHg (02/10 0436) Pulse Rate: 111 (02/10 0436)  Labs:  Recent Labs  09/19/15 0240 09/19/15 0319 09/19/15 0820  09/19/15 1431 09/19/15 2001 09/20/15 0437 09/21/15 0540 09/21/15 0545  HGB 15.7 18.4*  --   --   --   --  13.7 16.3  --   HCT 50.0 54.0*  --   --   --   --  42.8 48.4  --   PLT 283  --   --   --   --   --  209 181  --   HEPARINUNFRC  --   --   --   < >  --  0.35 0.42  --  0.21*  CREATININE 1.41* 1.10  --   --   --   --  0.90  --   --   TROPONINI  --   --  0.10*  --  0.11*  --   --   --   --   < > = values in this interval not displayed.  Estimated Creatinine Clearance: 123.4 mL/min (by C-G formula based on Cr of 0.9).   Assessment: 54yo male on heparin for Afib s/p successful DCCV in ED. Heparin level subtherapeutic (0.21) on 1500 units/hr. Plans for NOAC when more stable. CBC stable. No issues with line or bleeding reported per RN.  Goal of Therapy:  Heparin level 0.3-0.7 units/ml Monitor platelets by anticoagulation protocol: Yes   Plan:  -Increase heparin to 1700 units/hr -F/u 6 hr heparin level -Will follow oral anticoagulation plans  Christoper Fabian, PharmD, BCPS Clinical pharmacist, pager [phone] 09/21/2015 6:55 AM

## 2015-09-21 NOTE — Progress Notes (Signed)
Subjective:  Feels great. No chest pain or dyspnea.  Objective:  Vital Signs in the last 24 hours: Temp:  [97.8 F (36.6 C)-99.1 F (37.3 C)] 98.4 F (36.9 C) (02/10 0436) Pulse Rate:  [39-127] 77 (02/10 0500) Resp:  [16-24] 18 (02/10 0500) BP: (96-138)/(65-115) 129/85 mmHg (02/10 0436) SpO2:  [91 %-98 %] 91 % (02/10 0500) Arterial Line BP: (141-185)/(76-97) 152/91 mmHg (02/09 1700) Weight:  [110.814 kg (244 lb 4.8 oz)] 110.814 kg (244 lb 4.8 oz) (02/10 0436)  Intake/Output from previous day: 02/09 0701 - 02/10 0700 In: 855 [P.O.:720; I.V.:135] Out: 7300 [Urine:7300]  Physical Exam: Pt is alert and oriented, NAD HEENT: normal Neck: JVP - normal, carotids 2+= without bruits Lungs: CTA bilaterally CV: Tachycardic and irregular without murmur or gallop Abd: soft, NT, Positive BS, no hepatomegaly Ext: no C/C/E, distal pulses intact and equal Skin: warm/dry no rash   Lab Results:  Recent Labs  09/20/15 0437 09/21/15 0540  WBC 7.8 8.2  HGB 13.7 16.3  PLT 209 181    Recent Labs  09/20/15 0437 09/21/15 0540  NA 141 142  K 3.4* 3.0*  CL 106 102  CO2 23 26  GLUCOSE 128* 94  BUN 8 8  CREATININE 0.90 0.85    Recent Labs  09/19/15 0820 09/19/15 1431  TROPONINI 0.10* 0.11*    Cardiac Studies: 2D Echo: Study Conclusions  - Left ventricle: The cavity size was mildly dilated. Systolic function was moderately to severely reduced. The estimated ejection fraction was in the range of 30% to 35%. There is akinesis of the inferior myocardium. Doppler parameters are consistent with high ventricular filling pressure. - Aortic valve: Trileaflet; mildly thickened, mildly calcified leaflets. - Mitral valve: Calcified annulus. Mildly thickened, mildly calcified leaflets . There was moderate regurgitation. - Left atrium: The atrium was moderately dilated. - Pulmonary arteries: Systolic pressure was mildly increased. PA peak pressure: 47 mm Hg  (S). - Pericardium, extracardiac: There was a left pleural effusion.  Tele: Atrial fib with RVR since 8pm last night, this am heart rate 130-160  Assessment/Plan:  1. Atrial fibrillation with RVR: Considering the patient's congestive heart failure and low ejection fraction, I think our best option is to start him on IV amiodarone. He remains on heparin. Will ultimately start him on oral anticoagulation.  2. Acute systolic CHF: possible tachy-mediated versus ischemic as most likely etiologies of severe LV dysfunction. Will titrate medical therapy once he converted from atrial fibrillation on IV amiodarone. Will start an ACE inhibitor and beta blocker. Marked improvement with diuresis yesterday.   3. Elevated troponin: May be a result of heart failure and rapid atrial fibrillation. However, the patient does have low ejection fraction and has never had ischemic evaluation. I think cardiac catheterization is indicated. I have reviewed the risks, indications, and alternatives to cardiac catheterization, possible angioplasty, and stenting with the patient. Risks include but are not limited to bleeding, infection, vascular injury, stroke, myocardial infection, arrhythmia, kidney injury, radiation-related injury in the case of prolonged fluoroscopy use, emergency cardiac surgery, and death. The patient understands the risks of serious complication is 1-2 in 1000 with diagnostic cardiac cath and 1-2% or less with angioplasty/stenting. After cardiac catheterization he can be started on oral anticoagulation for atrial fib.  4. Hypokalemia: will supplement potassium, related to diuresis  Disposition: Start IV amiodarone this morning. If the patient converts to sinus rhythm or obtains adequate rate control, anticipate cardiac catheterization this afternoon. We will also replete potassium this morning.  Further disposition pending cardiac catheterization results. The patient is greatly improved after  diuresis.  Lenee Franze, M.D. 09/21/2015, 7:59 AM     

## 2015-09-21 NOTE — Interval H&P Note (Signed)
History and Physical Interval Note:  09/21/2015 1:03 PM  Marvin Estrada  has presented today for surgery, with the diagnosis of A. fib with RVR and reduced EF, positive troponin cannot exclude ACS.  The various methods of treatment have been discussed with the patient and family. After consideration of risks, benefits and other options for treatment, the patient has consented to  Procedure(s): Left Heart Cath and Coronary Angiography (N/A) With Possible Percutaneous Coronary Intervention as a surgical intervention .  The patient's history has been reviewed, patient examined, no change in status, stable for surgery.  I have reviewed the patient's chart and labs.  Questions were answered to the patient's satisfaction.     Cath Lab Visit (complete for each Cath Lab visit)  Clinical Evaluation Leading to the Procedure:   ACS: Yes.    Non-ACS:    Anginal Classification: CCS IV  Anti-ischemic medical therapy: Minimal Therapy (1 class of medications)  Non-Invasive Test Results: No non-invasive testing performed -  echocardiogram with reduced ejection fraction  Prior CABG: No previous CABG   AUC for cardiac catheterization CAD Assessment (Coronary Angiography With or Without Left Heart Catheterization and/or Left Ventriculography)  Patient Information:  Suspected ACS, Low Risk  AUC Score:   A (7)   Indication:   4   AUC for PCI pending diagnostic catheterization   HARDING, DAVID W

## 2015-09-21 NOTE — H&P (View-Only) (Signed)
Subjective:  Feels great. No chest pain or dyspnea.  Objective:  Vital Signs in the last 24 hours: Temp:  [97.8 F (36.6 C)-99.1 F (37.3 C)] 98.4 F (36.9 C) (02/10 0436) Pulse Rate:  [39-127] 77 (02/10 0500) Resp:  [16-24] 18 (02/10 0500) BP: (96-138)/(65-115) 129/85 mmHg (02/10 0436) SpO2:  [91 %-98 %] 91 % (02/10 0500) Arterial Line BP: (141-185)/(76-97) 152/91 mmHg (02/09 1700) Weight:  [110.814 kg (244 lb 4.8 oz)] 110.814 kg (244 lb 4.8 oz) (02/10 0436)  Intake/Output from previous day: 02/09 0701 - 02/10 0700 In: 855 [P.O.:720; I.V.:135] Out: 7300 [Urine:7300]  Physical Exam: Pt is alert and oriented, NAD HEENT: normal Neck: JVP - normal, carotids 2+= without bruits Lungs: CTA bilaterally CV: Tachycardic and irregular without murmur or gallop Abd: soft, NT, Positive BS, no hepatomegaly Ext: no C/C/E, distal pulses intact and equal Skin: warm/dry no rash   Lab Results:  Recent Labs  09/20/15 0437 09/21/15 0540  WBC 7.8 8.2  HGB 13.7 16.3  PLT 209 181    Recent Labs  09/20/15 0437 09/21/15 0540  NA 141 142  K 3.4* 3.0*  CL 106 102  CO2 23 26  GLUCOSE 128* 94  BUN 8 8  CREATININE 0.90 0.85    Recent Labs  09/19/15 0820 09/19/15 1431  TROPONINI 0.10* 0.11*    Cardiac Studies: 2D Echo: Study Conclusions  - Left ventricle: The cavity size was mildly dilated. Systolic function was moderately to severely reduced. The estimated ejection fraction was in the range of 30% to 35%. There is akinesis of the inferior myocardium. Doppler parameters are consistent with high ventricular filling pressure. - Aortic valve: Trileaflet; mildly thickened, mildly calcified leaflets. - Mitral valve: Calcified annulus. Mildly thickened, mildly calcified leaflets . There was moderate regurgitation. - Left atrium: The atrium was moderately dilated. - Pulmonary arteries: Systolic pressure was mildly increased. PA peak pressure: 47 mm Hg  (S). - Pericardium, extracardiac: There was a left pleural effusion.  Tele: Atrial fib with RVR since 8pm last night, this am heart rate 130-160  Assessment/Plan:  1. Atrial fibrillation with RVR: Considering the patient's congestive heart failure and low ejection fraction, I think our best option is to start him on IV amiodarone. He remains on heparin. Will ultimately start him on oral anticoagulation.  2. Acute systolic CHF: possible tachy-mediated versus ischemic as most likely etiologies of severe LV dysfunction. Will titrate medical therapy once he converted from atrial fibrillation on IV amiodarone. Will start an ACE inhibitor and beta blocker. Marked improvement with diuresis yesterday.   3. Elevated troponin: May be a result of heart failure and rapid atrial fibrillation. However, the patient does have low ejection fraction and has never had ischemic evaluation. I think cardiac catheterization is indicated. I have reviewed the risks, indications, and alternatives to cardiac catheterization, possible angioplasty, and stenting with the patient. Risks include but are not limited to bleeding, infection, vascular injury, stroke, myocardial infection, arrhythmia, kidney injury, radiation-related injury in the case of prolonged fluoroscopy use, emergency cardiac surgery, and death. The patient understands the risks of serious complication is 1-2 in 1000 with diagnostic cardiac cath and 1-2% or less with angioplasty/stenting. After cardiac catheterization he can be started on oral anticoagulation for atrial fib.  4. Hypokalemia: will supplement potassium, related to diuresis  Disposition: Start IV amiodarone this morning. If the patient converts to sinus rhythm or obtains adequate rate control, anticipate cardiac catheterization this afternoon. We will also replete potassium this morning.  Further disposition pending cardiac catheterization results. The patient is greatly improved after  diuresis.  Tonny Bollman, M.D. 09/21/2015, 7:59 AM

## 2015-09-22 ENCOUNTER — Encounter (HOSPITAL_COMMUNITY): Payer: Self-pay | Admitting: Internal Medicine

## 2015-09-22 LAB — CBC
HEMATOCRIT: 45.8 % (ref 39.0–52.0)
Hemoglobin: 15 g/dL (ref 13.0–17.0)
MCH: 28.5 pg (ref 26.0–34.0)
MCHC: 32.8 g/dL (ref 30.0–36.0)
MCV: 87.1 fL (ref 78.0–100.0)
PLATELETS: 191 10*3/uL (ref 150–400)
RBC: 5.26 MIL/uL (ref 4.22–5.81)
RDW: 13.3 % (ref 11.5–15.5)
WBC: 7.8 10*3/uL (ref 4.0–10.5)

## 2015-09-22 LAB — CULTURE, BLOOD (ROUTINE X 2)

## 2015-09-22 LAB — GLUCOSE, CAPILLARY
GLUCOSE-CAPILLARY: 96 mg/dL (ref 65–99)
Glucose-Capillary: 88 mg/dL (ref 65–99)
Glucose-Capillary: 105 mg/dL — ABNORMAL HIGH (ref 65–99)

## 2015-09-22 MED ORDER — ROSUVASTATIN CALCIUM 5 MG PO TABS: 5.0000 mg | ORAL_TABLET | Freq: Every day | ORAL | Status: AC

## 2015-09-22 MED ORDER — RIVAROXABAN 20 MG PO TABS: 20.0000 mg | ORAL_TABLET | Freq: Every day | ORAL | Status: AC

## 2015-09-22 MED ORDER — ROSUVASTATIN CALCIUM 10 MG PO TABS: 5.0000 mg | ORAL_TABLET | Freq: Every day | ORAL | Status: DC

## 2015-09-22 MED ORDER — POTASSIUM CHLORIDE CRYS ER 20 MEQ PO TBCR: 20.0000 meq | EXTENDED_RELEASE_TABLET | Freq: Two times a day (BID) | ORAL | Status: DC

## 2015-09-22 MED ORDER — CARVEDILOL 6.25 MG PO TABS: 6.2500 mg | ORAL_TABLET | Freq: Two times a day (BID) | ORAL | Status: DC

## 2015-09-22 MED ORDER — CARVEDILOL 6.25 MG PO TABS
6.2500 mg | ORAL_TABLET | Freq: Two times a day (BID) | ORAL | Status: DC
  Administered 2015-09-22: 6.25 mg via ORAL
  Filled 2015-09-22: qty 1

## 2015-09-22 MED ORDER — LISINOPRIL 5 MG PO TABS: 5.0000 mg | ORAL_TABLET | Freq: Every day | ORAL | Status: AC

## 2015-09-22 MED ORDER — LEVOFLOXACIN 750 MG PO TABS: 750.0000 mg | ORAL_TABLET | Freq: Every day | ORAL | Status: DC

## 2015-09-22 MED ORDER — LISINOPRIL 5 MG PO TABS
5.0000 mg | ORAL_TABLET | Freq: Every day | ORAL | Status: DC
  Administered 2015-09-22: 5 mg via ORAL
  Filled 2015-09-22: qty 1

## 2015-09-22 MED ORDER — FUROSEMIDE 40 MG PO TABS: 40.0000 mg | ORAL_TABLET | Freq: Two times a day (BID) | ORAL | Status: AC

## 2015-09-22 NOTE — Consult Note (Addendum)
ELECTROPHYSIOLOGY CONSULT NOTE    Primary Care Physician: No primary care provider on file. Referring Physician:  Dr Excell Seltzer  Admit Date: 09/19/2015  Reason for consultation:  Atrial fibrillation  Marvin Estrada is a 55 y.o. male with a h/o newly diagnosed atrial fibrillation and nonischemic cardiomyopathy.  He is admitted with acute decompensated CHF in the setting of AF with RVR.  He reports symptoms of progressive SOB.  He has been admitted and rate controlled with IV amiodarone.  He is s/p cath which revealed no obstructive CAD.  He is now on xarelto.  His SOB is better and he wants to go home. He is frustrated with having to be in the hospital and hopes to return to work soon.   He is unaware of his afib. He snores He drinks heavy ETOH but says that he quit a month ago.    Today, he denies symptoms of palpitations, chest pain, lower extremity edema, dizziness, presyncope, syncope, or neurologic sequela. The patient is tolerating medications without difficulties and is otherwise without complaint today.   Past Medical History  Diagnosis Date  . Hypertension   . Diabetes mellitus without complication (HCC)   . Kidney stones   . Persistent atrial fibrillation (HCC)   . Nonischemic cardiomyopathy (HCC)   . Obesity   . Snoring    Past Surgical History  Procedure Laterality Date  . Cardiac catheterization N/A 09/21/2015    Procedure: Left Heart Cath and Coronary Angiography;  Surgeon: Marykay Lex, MD;  Location: [facility] INVASIVE CV LAB;  Service: Cardiovascular;  Laterality: N/A;    . carvedilol  6.25 mg Oral BID WC  . furosemide  40 mg Oral BID  . insulin aspart  0-20 Units Subcutaneous 6 times per day  . levofloxacin  750 mg Oral Daily  . lisinopril  5 mg Oral Daily  . pantoprazole  40 mg Oral Daily  . potassium chloride  20 mEq Oral BID  . rivaroxaban  20 mg Oral Q supper  . rosuvastatin  5 mg Oral q1800  . sodium chloride flush  3 mL Intravenous Q12H   . amiodarone 30 mg/hr  (09/22/15 1000)    No Known Allergies  Social History   Social History  . Marital Status: Married    Spouse Name: N/A  . Number of Children: N/A  . Years of Education: N/A   Occupational History  . Not on file.   Social History Main Topics  . Smoking status: Current Some Day Smoker -- 1.00 packs/day    Types: Cigarettes  . Smokeless tobacco: Not on file     Comment: 1 pack per week  . Alcohol Use: 0.0 oz/week    0 Standard drinks or equivalent per week     Comment: 12 pk twice/week  . Drug Use: No  . Sexual Activity: Not on file   Other Topics Concern  . Not on file   Social History Narrative   Works as an Personnel officer for [employer]    Family History  Problem Relation Age of Onset  . Hypertension Mother   . Hypertension Father   . Prostate cancer Father     ROS- All systems are reviewed and negative except as per the HPI above  Physical Exam: Telemetry: Filed Vitals:   09/21/15 1800 09/21/15 2007 09/22/15 0513 09/22/15 0915  BP: 119/99 133/98 133/93 122/74  Pulse:  98 75 66  Temp:  99.3 F (37.4 C) 99 F (37.2 C) 99 F (37.2  C)  TempSrc:  Oral Oral Oral  Resp:  20 18 16   Height:      Weight:   245 lb 12.8 oz (111.494 kg)   SpO2:  98% 95% 95%    GEN- The patient is overweight appearing, alert and oriented x 3 today.   Head- normocephalic, atraumatic Eyes-  Sclera clear, conjunctiva pink Ears- hearing intact Oropharynx- clear Neck- supple,   Lungs- Clear to ausculation bilaterally, normal work of breathing Heart- irregular rate and rhythm, no murmurs, rubs or gallops, PMI not laterally displaced GI- soft, NT, ND, + BS Extremities- no clubbing, cyanosis, or edema MS- no significant deformity or atrophy Skin- no rash or lesion Psych- frustrated with having to be in the hospital Neuro- strength and sensation are intact  EKG afib with RVR (V rate 188 bpm)  Labs:   Lab Results  Component Value Date   WBC 7.8 09/22/2015   HGB 15.0  09/22/2015   HCT 45.8 09/22/2015   MCV 87.1 09/22/2015   PLT 191 09/22/2015    Recent Labs Lab 09/19/15 0240  09/21/15 0540  NA 141  < > 142  K 3.6  < > 3.0*  CL 105  < > 102  CO2 20*  < > 26  BUN 11  < > 8  CREATININE 1.41*  < > 0.85  CALCIUM 8.7*  < > 8.9  PROT 6.3*  --   --   BILITOT 0.9  --   --   ALKPHOS 40  --   --   ALT 28  --   --   AST 40  --   --   GLUCOSE 288*  < > 94  < > = values in this interval not displayed. Lab Results  Component Value Date   TROPONINI 0.11* 09/19/2015   No results found for: CHOL No results found for: HDL No results found for: LDLCALC No results found for: TRIG No results found for: CHOLHDL No results found for: LDLDIRECT    Echo:  EF 30%, moderate MR, moderate LA enlargement Cath reviewed  ASSESSMENT AND PLAN:   1. Persistent atrial fibrillation Unknown duration Symptoms are related to CHF Will start coreg for rate control Transition amiodarone to oral 200mg  BID chads2vasc score is at least 3.  On xarelto Will ask Research team to evaluate for Genetic AF Will likely need cardioversion after 3 weeks of anticoagulation  2. Acute systolic dysfunction/ nonischemic CM Will hopefully improve with control of afib Start coreg and lisinopril today Continue lasix Consider Genetic AF study as above  3. Diabetes Will check lipids and start on crestor as per AHA GWTGL  4. Obesity Weight reduction encouraged  5. ETOH  Cessation advised  6. Snoring Will need outpatient sleep study  Plan to discharge to home  Will need to follow-up next week in AF clinic Importance of compliance discussed with patient     Hillis Range, MD 09/22/2015  11:06 AM

## 2015-09-22 NOTE — Discharge Summary (Signed)
Discharge Summary    Patient ID: DAEMOND CLEARWATER,  MRN: [id number], DOB/AGE: 53-Sep-1962 53 y.o.  Admit date: 09/19/2015 Discharge date: 09/22/2015  Primary Care Provider: No primary care provider on file. Primary Cardiologist: New (dr. Excell Seltzer) EP: Dr. Johney Frame  Discharge Diagnoses    Principal Problem:   Atrial fibrillation with RVR [facility]) Active Problems:   Chest pain with high risk for cardiac etiology   Paroxysmal atrial fibrillation (HCC)   Acute respiratory failure with hypoxia (HCC)   Acute pulmonary edema (HCC)   Acute combined systolic  heart failure (HCC)   Nonischemic CM   Elevated troponin   Hypokalemia   Obesity    Possible CAP   AKI   Allergies No Known Allergies  Diagnostic Studies/Procedures   Transthoracic Echocardiography 09/20/15 LV EF: 30% -  35%  ------------------------------------------------------------------- Indications:   Chest pain 786.51.  ------------------------------------------------------------------- History:  Risk factors: Current tobacco use. Hypertension. Diabetes mellitus.  ------------------------------------------------------------------- Study Conclusions  - Left ventricle: The cavity size was mildly dilated. Systolic function was moderately to severely reduced. The estimated ejection fraction was in the range of 30% to 35%. There is akinesis of the inferior myocardium. Doppler parameters are consistent with high ventricular filling pressure. - Aortic valve: Trileaflet; mildly thickened, mildly calcified leaflets. - Mitral valve: Calcified annulus. Mildly thickened, mildly calcified leaflets . There was moderate regurgitation. - Left atrium: The atrium was moderately dilated. - Pulmonary arteries: Systolic pressure was mildly increased. PA peak pressure: 47 mm Hg (S). - Pericardium, extracardiac: There was a left pleural effusion.  Left Heart Cath and Coronary Angiography 09/21/15    Conclusion      1. 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 2. Angiographically normal coronary arteries 3. There is severe left ventricular systolic dysfunction -with mildly elevated LVEDP   Reduced ejection fraction most likely related to acute onset A. fib RVR with tachycardia-induced cardiomyopathy.  Plan:  Return to nursing unit for post radial cath care with TR band removal  Continue IV amiodarone for rate/rhythm control  Will initiate Xarelto tonight with evening meal   Remainder of Treatment per Primary Service       History of Present Illness     Mr. Hariri is a 55 yo man with PMH of hypertension and T2DM who has had some dyspnea for 1-2 days who woke up acutely  and his daughter brought called 911. He was found to be in atrial fibrillation with RVR and he was given adenosine 6 mg, 12 mg and then two rounds of diltiazem 10 mg IV without improvement. He was intubated for respiratory failure and he received procainamide for his atrial fibrillation given concern for possible atrial fibrillation with accessory pathway (WPW). He ultimately was converted with DCCV and is intubated and relatively hemodynamically stable on the ventilator. He was accompanied by his daughter and wife (currently separated). 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 He hasn't been complaining of any chest pain per the family. He went to his daughter's game (? Basketball) this evening without difficulty. No known sick contacts or symptoms of fever/chills/nausea/vomiting/diarrhea. He's about a 1/2 ppd smoker for 20+ years.  Hospital Course     Consultants: EP and Critical care (as admitting provider initially)  The patient was admitted to ICU by Critical care. Considering the patient's congestive heart failure and low ejection fraction,  started him on IV amiodarone. He was anticoagulated with IV  Heparin and then switched to xarelto. The patient  had a  persistent atrial fibrillation and evaluated by EP and felt likely due to CHF. Transitionedamiodarone to oral  BID. Will be f/u in Afib clinic and cardioversion after 3 weeks of anticoagulation.  Peak of troponin was  0.11.  Echo showed EF of 30-35%, akinesis of inferior myocardium, PA pressure of 47mm HG.  He is s/p cath which revealed no obstructive CAD. A1c of 6.0.   The patient has been seen by Dr. Johney Frame  today and deemed ready for discharge home. All follow-up appointments have been scheduled. Discharge medications are listed below.   Advised alcohol cessation. Will need outpatient sleep study for snoring. Weight reduction encouraged. Pending lipid panel. She will finished course of Levaquin  (7 day course) for presumed CAP per PCCM.   Discharge Vitals Blood pressure 122/74, pulse 66, temperature 99 F (37.2 C), temperature source Oral, resp. rate 16, height 6' (1.829 m), weight 245 lb 12.8 oz (111.494 kg), SpO2 95 %.  Filed Weights   09/20/15 0512 09/21/15 0436 09/22/15 0513  Weight: 256 lb 13.4 oz (116.5 kg) 244 lb 4.8 oz (110.814 kg) 245 lb 12.8 oz (111.494 kg)    Labs & Radiologic Studies     CBC  Recent Labs  09/21/15 0540 09/22/15 0545  WBC 8.2 7.8  HGB 16.3 15.0  HCT 48.4 45.8  MCV 87.8 87.1  PLT 181 191   Basic Metabolic Panel  Recent Labs  09/20/15 0437 09/21/15 0540  NA 141 142  K 3.4* 3.0*  CL 106 102  CO2 23 26  GLUCOSE 128* 94  BUN 8 8  CREATININE 0.90 0.85  CALCIUM 8.7* 8.9  MG 1.8 1.8  PHOS 4.1 4.0   Liver Function Tests No results for input(s): AST, ALT, ALKPHOS, BILITOT, PROT, ALBUMIN in the last 72 hours. No results for input(s): LIPASE, AMYLASE in the last 72 hours. Cardiac Enzymes  Recent Labs  09/19/15 1431  TROPONINI 0.11*     Dg Chest Port 1 View  09/20/2015  CLINICAL DATA:  55 year old male with acute respiratory failure. Hypoxia, atrial fibrillation. Initial encounter. EXAM: PORTABLE CHEST 1 VIEW COMPARISON:  09/19/2015  and earlier. FINDINGS: Portable AP semi upright view at 0547 hours. Extubated. Enteric tube removed. Stable lung volumes. Continued veiling bilateral veiling opacity, greater on the right. Dense retrocardiac opacity. No pneumothorax. Overall stable ventilation. Stable cardiac size and mediastinal contours. IMPRESSION: 1. Extubated and enteric tube removed. 2. Stable ventilation with bilateral veiling opacity suspicious for pleural effusions and dense lower lobe collapse or consolidation. Electronically Signed   By: Odessa Fleming M.D.   On: 09/20/2015 07:16   Dg Chest Port 1 View  09/19/2015  CLINICAL DATA:  Hypoxia EXAM: PORTABLE CHEST 1 VIEW COMPARISON:  Study obtained earlier in the day FINDINGS: Endotracheal tube tip is 5.3 cm above the carina. Nasogastric tube tip and side port are below the diaphragm. No pneumothorax. There is a a right pleural effusion with hazy airspace opacity in the right mid and lower lung zones, stable. There is new consolidation in the left base. Heart is borderline enlarged with pulmonary vascularity within normal limits. No adenopathy. IMPRESSION: Tube positions as described without pneumothorax. New left lower lobe consolidation. This finding raises concern for possible aspiration. There is a moderate right pleural effusion with hazy opacity in the right mid and lower lung zones, not felt to be changed. Question underlying pneumonia or aspiration right lung. Electronically Signed   By: Bretta Bang III M.D.   On: 09/19/2015 09:21  Dg Chest Portable 1 View  09/19/2015  CLINICAL DATA:  55 year old male status post intubation. EXAM: PORTABLE CHEST 1 VIEW COMPARISON:  Radiograph dated 10/30/2013 FINDINGS: Endotracheal tube with tip approximately 4.5 cm above the carina. The diffuse bilateral interstitial prominence. Patchy area of ground-glass airspace opacity noted in the right mid and lower lung field, increased from prior study. No pleural effusion. The left costophrenic angle has  been excluded from the image. No pneumothorax. The cardiac silhouette is within normal limits. Multiple old right rib fractures. IMPRESSION: Endotracheal tube above the carina. Patchy right lung ground-glass opacity, increased from prior study. Electronically Signed   By: Elgie Collard M.D.   On: 09/19/2015 02:50   Dg Abd Portable 1v  09/19/2015  CLINICAL DATA:  Orogastric tube placement EXAM: PORTABLE ABDOMEN - 1 VIEW COMPARISON:  CT abdomen and pelvis February 18, 2015 FINDINGS: Orogastric tube tip and side port are in the stomach. The bowel gas pattern is normal. No obstruction or free air is evident on this supine examination. IMPRESSION: Orogastric tube tip and side port in stomach. Bowel gas pattern unremarkable. Electronically Signed   By: Bretta Bang III M.D.   On: 09/19/2015 07:54    Disposition   Pt is being discharged home today in good condition.  Follow-up Plans & Appointments    Follow-up Information    Follow up with CARROLL,DONNA, NP.   Specialties:  Nurse Practitioner, Cardiology   Why:  office will call with appoinment   Contact information:   [address] [place] [address] [phone]      Discharge Instructions    Diet - low sodium heart healthy    Complete by:  As directed      Discharge instructions    Complete by:  As directed   You may return to work after seen in clinic next week.     Increase activity slowly    Complete by:  As directed            Discharge Medications   Current Discharge Medication List    START taking these medications   Details  carvedilol (COREG) 6.25 MG tablet Take 1 tablet (6.25 mg total) by mouth 2 (two) times daily with a meal. Qty: 60 tablet, Refills: 6    furosemide (LASIX) 40 MG tablet Take 1 tablet (40 mg total) by mouth 2 (two) times daily. Qty: 30 tablet, Refills: 11    levofloxacin (LEVAQUIN) 750 MG tablet Take 1 tablet (750 mg total) by mouth daily. Qty: 5 tablet, Refills: 0    lisinopril  (PRINIVIL,ZESTRIL) 5 MG tablet Take 1 tablet (5 mg total) by mouth daily. Qty: 30 tablet, Refills: 6    potassium chloride SA (K-DUR,KLOR-CON) 20 MEQ tablet Take 1 tablet (20 mEq total) by mouth 2 (two) times daily. Qty: 30 tablet, Refills: 6    rivaroxaban (XARELTO) 20 MG TABS tablet Take 1 tablet (20 mg total) by mouth daily with supper. Qty: 30 tablet, Refills: 6    rosuvastatin (CRESTOR) 5 MG tablet Take 1 tablet (5 mg total) by mouth daily at 6 PM. Qty: 30 tablet, Refills: 6      CONTINUE these medications which have NOT CHANGED   Details  acetaminophen (TYLENOL) 500 MG tablet Take 1,000 mg by mouth every 6 (six) hours as needed for moderate pain.    fluticasone (FLONASE) 50 MCG/ACT nasal spray Place 1 spray into both nostrils as needed for allergies or rhinitis.    omeprazole (PRILOSEC) 20 MG capsule  Take 20 mg by mouth as needed (FOR HEARTBURN).           Outstanding Labs/Studies   Pending Lipid panel  Duration of Discharge Encounter   Greater than 30 minutes including physician time.  Signed, Bhagat,Bhavinkumar PA-C 09/22/2015, 11:46 AM      Jarold Song

## 2015-09-24 LAB — CULTURE, BLOOD (ROUTINE X 2): Culture: NO GROWTH

## 2015-09-26 ENCOUNTER — Ambulatory Visit (HOSPITAL_COMMUNITY)
Admission: RE | Admit: 2015-09-26 | Discharge: 2015-09-26 | Disposition: A | Discharge status: Home / Self Care | Payer: BC Managed Care – PPO | Source: Ambulatory Visit | Attending: Nurse Practitioner | Admitting: Nurse Practitioner

## 2015-09-26 ENCOUNTER — Encounter (HOSPITAL_COMMUNITY): Payer: Self-pay | Admitting: Nurse Practitioner

## 2015-09-26 VITALS — BP 140/80 | HR 99 | Ht 70.0 in | Wt 243.8 lb

## 2015-09-26 DIAGNOSIS — I481 Persistent atrial fibrillation: Secondary | ICD-10-CM | POA: Insufficient documentation

## 2015-09-26 DIAGNOSIS — Z87891 Personal history of nicotine dependence: Secondary | ICD-10-CM | POA: Diagnosis not present

## 2015-09-26 DIAGNOSIS — I4891 Unspecified atrial fibrillation: Secondary | ICD-10-CM | POA: Diagnosis present

## 2015-09-26 DIAGNOSIS — Z8249 Family history of ischemic heart disease and other diseases of the circulatory system: Secondary | ICD-10-CM | POA: Diagnosis not present

## 2015-09-26 DIAGNOSIS — R0683 Snoring: Secondary | ICD-10-CM | POA: Insufficient documentation

## 2015-09-26 DIAGNOSIS — Z6834 Body mass index (BMI) 34.0-34.9, adult: Secondary | ICD-10-CM | POA: Diagnosis not present

## 2015-09-26 DIAGNOSIS — Z8042 Family history of malignant neoplasm of prostate: Secondary | ICD-10-CM | POA: Diagnosis not present

## 2015-09-26 DIAGNOSIS — Z79899 Other long term (current) drug therapy: Secondary | ICD-10-CM | POA: Insufficient documentation

## 2015-09-26 DIAGNOSIS — I4819 Other persistent atrial fibrillation: Secondary | ICD-10-CM

## 2015-09-26 DIAGNOSIS — E669 Obesity, unspecified: Secondary | ICD-10-CM | POA: Insufficient documentation

## 2015-09-26 DIAGNOSIS — E119 Type 2 diabetes mellitus without complications: Secondary | ICD-10-CM | POA: Insufficient documentation

## 2015-09-26 DIAGNOSIS — I428 Other cardiomyopathies: Secondary | ICD-10-CM | POA: Diagnosis not present

## 2015-09-26 DIAGNOSIS — Z7902 Long term (current) use of antithrombotics/antiplatelets: Secondary | ICD-10-CM | POA: Insufficient documentation

## 2015-09-26 DIAGNOSIS — I10 Essential (primary) hypertension: Secondary | ICD-10-CM | POA: Diagnosis not present

## 2015-09-26 NOTE — Patient Instructions (Signed)
Scheduler will be in touch with you regarding sleep study once they have talked to your insurance company.  Scheduler will be in touch with you regarding follow up appointment with Dr. Johney Frame in 4 weeks.

## 2015-09-26 NOTE — Progress Notes (Signed)
Patient ID: Marvin Estrada, male   DOB: 12-20-1960, 55 y.o.   MRN: [id number]     Primary Care Physician: No primary care provider on file. Referring Physician: Dr. Johney Frame  [facility] f/u   Marvin Estrada is a 55 y.o. male with a h/o hypertension and T2DM who has had some dyspnea for 1-2 days who woke up acutely and his daughter brought called 911. He was found to be in atrial fibrillation with RVR and he was given adenosine 6 mg, 12 mg and then two rounds of diltiazem 10 mg IV without improvement. He was intubated for respiratory failure and he received procainamide for his atrial fibrillation given concern for possible atrial fibrillation with accessory pathway (WPW). He ultimately was converted with DCCV, but with ERAF, and was intubated and relatively hemodynamically stable on the ventilator. He was accompanied by his daughter and wife (currently separated). 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 He hasn't been complaining of any chest pain per the family. He went to his daughter's game (? Basketball) this evening without difficulty. No known sick contacts or symptoms of fever/chills/nausea/vomiting/diarrhea. He's about a 1/2 ppd smoker for 20+ years.  The patient was admitted to ICU by Critical care. Considering the patient's congestive heart failure and low ejection fraction, started him on IV amiodarone. He was anticoagulated with IV Heparin and then switched to xarelto. The patient had a persistent atrial fibrillation and evaluated by EP and felt likely due to CHF. Recommended change of IV amiodarone to oral 200mg  BID. Will be seen in Afib clinic and cardioversion after 3 weeks of anticoagulation. Peak of troponin was 0.11. Echo showed EF of 30-35%, akinesis of inferior myocardium, PA pressure of 15mm HG. He is s/p cath which revealed no obstructive CAD. A1c of 6.0.   He is in afib clinic today for f/u. States he feels the best that he has in  years. Unfortunately, he is in afib with RVR at 121 bpm. Review of meds reveals, by error, that he was not discharged on amiodarone 200 mb bid as recommended. He also mentioned that he threw up his morning meds, thinks it was the egg sandwhich, which will sometimes do that.Heart rate checked later and it was around 100 bpm. He does snore with periods of apnea and will need a sleep study, however the pt states that he really does not want to do the study due to cost. Discussed how untreated sleep apnea can undermine ability to restore rhythm.  He states that he has quit smoking and does not use alcohol any longer. He does drink 3 cups of coffee q am, but will try to hold it to two cups a day. He is trying to get more exercise and states that he has lost 50 lbs since last fall, he contributes to stress and cutting back on what he is eating.He is taking xarelto( chadsvasc score of 3, htn, dm, lv dysfunction) and other meds as per instructed. Stressed importance of taking blood thinner on a regular basis, especially with planned cardioversion in  3 weeks.  Today, he denies symptoms of palpitations, chest pain, shortness of breath, orthopnea, PND, lower extremity edema, dizziness, presyncope, syncope, or neurologic sequela. The patient is tolerating medications without difficulties and is otherwise without complaint today.   Past Medical History  Diagnosis Date  . Hypertension   . Diabetes mellitus without complication (HCC)   . Kidney stones   . Persistent atrial fibrillation (HCC)   .  Nonischemic cardiomyopathy (HCC)   . Obesity   . Snoring    Past Surgical History  Procedure Laterality Date  . Cardiac catheterization N/A 09/21/2015    Procedure: Left Heart Cath and Coronary Angiography;  Surgeon: Marykay Lex, MD;  Location: [facility] INVASIVE CV LAB;  Service: Cardiovascular;  Laterality: N/A;    Current Outpatient Prescriptions  Medication Sig Dispense Refill  . acetaminophen (TYLENOL) 500 MG  tablet Take 1,000 mg by mouth every 6 (six) hours as needed for moderate pain.    . carvedilol (COREG) 6.25 MG tablet Take 1 tablet (6.25 mg total) by mouth 2 (two) times daily with a meal. 60 tablet 6  . fluticasone (FLONASE) 50 MCG/ACT nasal spray Place 1 spray into both nostrils as needed for allergies or rhinitis.    . furosemide (LASIX) 40 MG tablet Take 1 tablet (40 mg total) by mouth 2 (two) times daily. 30 tablet 11  . levofloxacin (LEVAQUIN) 750 MG tablet Take 1 tablet (750 mg total) by mouth daily. 5 tablet 0  . lisinopril (PRINIVIL,ZESTRIL) 5 MG tablet Take 1 tablet (5 mg total) by mouth daily. 30 tablet 6  . omeprazole (PRILOSEC) 20 MG capsule Take 20 mg by mouth as needed (FOR HEARTBURN).    . potassium chloride SA (K-DUR,KLOR-CON) 20 MEQ tablet Take 1 tablet (20 mEq total) by mouth 2 (two) times daily. 30 tablet 6  . rivaroxaban (XARELTO) 20 MG TABS tablet Take 1 tablet (20 mg total) by mouth daily with supper. 30 tablet 6  . rosuvastatin (CRESTOR) 5 MG tablet Take 1 tablet (5 mg total) by mouth daily at 6 PM. 30 tablet 6   No current facility-administered medications for this encounter.    No Known Allergies  Social History   Social History  . Marital Status: Married    Spouse Name: N/A  . Number of Children: N/A  . Years of Education: N/A   Occupational History  . Not on file.   Social History Main Topics  . Smoking status: Current Some Day Smoker -- 1.00 packs/day    Types: Cigarettes  . Smokeless tobacco: Not on file     Comment: 1 pack per week  . Alcohol Use: 0.0 oz/week    0 Standard drinks or equivalent per week     Comment: 12 pk twice/week  . Drug Use: No  . Sexual Activity: Not on file   Other Topics Concern  . Not on file   Social History Narrative   Works as an Personnel officer for [employer]    Family History  Problem Relation Age of Onset  . Hypertension Mother   . Hypertension Father   . Prostate cancer Father     ROS- All  systems are reviewed and negative except as per the HPI above  Physical Exam: Filed Vitals:   09/26/15 1124 09/26/15 1247  BP: 140/80   Pulse: 121 99  Height: 5\' 10"  (1.778 m)   Weight: 243 lb 12.8 oz (110.587 kg)     GEN- The patient is well appearing, alert and oriented x 3 today.   Head- normocephalic, atraumatic Eyes-  Sclera clear, conjunctiva pink Ears- hearing intact Oropharynx- clear Neck- supple, no JVP Lymph- no cervical lymphadenopathy Lungs- Clear to ausculation bilaterally, normal work of breathing Heart- Rapid,irregular rate and rhythm, no murmurs, rubs or gallops, PMI not laterally displaced GI- soft, NT, ND, + BS Extremities- no clubbing, cyanosis, or edema MS- no significant deformity or atrophy Skin- no rash or lesion  Psych- euthymic mood, full affect Neuro- strength and sensation are intact  EKG-afib with rvr, v rate 121 bpm, qrs int 84 ms, qtc 431 ms Epic records reviewed  Assessment and Plan: 1. Persistent asymptomatic  afib with rvr Thought to have had afib longer than just prior to admission due to LV dysfunction, probable TMC Discussed with Dr. Johney Frame re amiodarone not being started on d/c For now will not start  HR may be falsely elevated today, due to vomiting meds this am Take 1/2 tab of carvedilol on arrival home and usual dose this pm Recheck on Friday for BP/HR and will discuss with dr. Johney Frame further rate/or rhythm control Plan on DCCV after 3 weeks of anticoagulation  2. HTN Stable  3. Snoring/apnea Sleep study requested Pt is not excited re having it done due to cost Can discuss with sleep lab when they call to schedule, how much it would cost him out of pocket.  4. Chadsvasc score of 3 Continue xarelto, stressed no missed doses Bleeding precautions also discussed  5. TMC Discussed very important to rate control/ restore SR, to prevent further deterioration of EF Continue carvedilol, furosemide Avoid salt, weigh daily States he  has stopped drinking alcohol, stopped smoking Regular exercise encouraged Congratulated on weight reduction and encouraged to continue until reaches goal weight  6. Referral to genetic AF Jena Gauss was in from research to discuss with pt.   F/u Friday for HR/BP check  Repeat bmet Friday as well  since pt new to lasix, ace  Lupita Leash [facility] [address] [place], [place] [address] [phone]

## 2015-09-28 ENCOUNTER — Encounter: Payer: Self-pay | Admitting: *Deleted

## 2015-09-28 ENCOUNTER — Ambulatory Visit (HOSPITAL_COMMUNITY)
Admission: RE | Admit: 2015-09-28 | Discharge: 2015-09-28 | Disposition: A | Discharge status: Home / Self Care | Payer: BC Managed Care – PPO | Source: Ambulatory Visit | Attending: Nurse Practitioner | Admitting: Nurse Practitioner

## 2015-09-28 DIAGNOSIS — I481 Persistent atrial fibrillation: Secondary | ICD-10-CM | POA: Diagnosis not present

## 2015-09-28 DIAGNOSIS — I4891 Unspecified atrial fibrillation: Secondary | ICD-10-CM | POA: Insufficient documentation

## 2015-09-28 DIAGNOSIS — Z006 Encounter for examination for normal comparison and control in clinical research program: Secondary | ICD-10-CM

## 2015-09-28 LAB — BASIC METABOLIC PANEL
ANION GAP: 11 (ref 5–15)
BUN: 9 mg/dL (ref 6–20)
CO2: 24 mmol/L (ref 22–32)
Calcium: 10.1 mg/dL (ref 8.9–10.3)
Chloride: 103 mmol/L (ref 101–111)
Creatinine, Ser: 0.83 mg/dL (ref 0.61–1.24)
GFR calc Af Amer: >60 mL/min (ref >60)
GFR calc non Af Amer: >60 mL/min (ref >60)
GLUCOSE: 107 mg/dL — AB (ref 65–99)
POTASSIUM: 4.3 mmol/L (ref 3.5–5.1)
Sodium: 138 mmol/L (ref 135–145)

## 2015-09-28 MED ORDER — AMIODARONE HCL 200 MG PO TABS: 200.0000 mg | ORAL_TABLET | Freq: Two times a day (BID) | ORAL | Status: DC

## 2015-09-28 NOTE — Patient Instructions (Signed)
Your physician has recommended you make the following change in your medication:  1)Amiodarone 200mg twice a day  

## 2015-09-28 NOTE — Progress Notes (Addendum)
Patient in for EKG/BP check for evaluation of rate control. Marvin Coco NP to review EKG/BP.  Pt back for ekg to assess rate control. Still with Afib with rvr. Discussed with Dr. Johney Frame and will add amiodarone 200 mg bid for rate control and he will return for EKG in one week. He is currently feeling very well, no dizziness, chest pain, shortness of breath and will allow to go back to work without limitations. V rate 110 bpm, qrs int 80 ms, qtc 422 ms. Genetic af research team in to enroll pt.

## 2015-09-28 NOTE — Progress Notes (Signed)
Marvin Estrada meets criteria for the GENETIC-AF study. The study was explained to the patient to include risks versus benefits. The patient was given an opportunity to read the consent and ask questions. Patient signed the informed consent form and was given a copy. No study related procedures were performed prior to the informed consent process.

## 2015-10-01 ENCOUNTER — Institutional Professional Consult (permissible substitution): Payer: BC Managed Care – PPO | Admitting: Internal Medicine

## 2015-10-02 ENCOUNTER — Telehealth: Payer: Self-pay | Admitting: *Deleted

## 2015-10-02 NOTE — Telephone Encounter (Signed)
Called Marvin Estrada to let him know his blood work came back and he does possess the Arg/Arg genotype. I will follow-up with him on Thursday to discuss further participation in the GENETIC-AF trial.

## 2015-10-02 NOTE — Telephone Encounter (Signed)
Called patient to let him know that the estimate from billing would be around $400 for his sleep study.   He had previously stated that if he had to pay anything he would not go through with the study.  Patient still states that he is not willing to go through with the study at this time.   Have routed to Rudi Coco to let her know.

## 2015-10-04 ENCOUNTER — Ambulatory Visit (HOSPITAL_COMMUNITY)
Admission: RE | Admit: 2015-10-04 | Discharge: 2015-10-04 | Disposition: A | Discharge status: Home / Self Care | Payer: BC Managed Care – PPO | Source: Ambulatory Visit | Attending: Nurse Practitioner | Admitting: Nurse Practitioner

## 2015-10-04 DIAGNOSIS — I4891 Unspecified atrial fibrillation: Secondary | ICD-10-CM | POA: Insufficient documentation

## 2015-10-04 DIAGNOSIS — R9431 Abnormal electrocardiogram [ECG] [EKG]: Secondary | ICD-10-CM | POA: Insufficient documentation

## 2015-10-04 DIAGNOSIS — I480 Paroxysmal atrial fibrillation: Secondary | ICD-10-CM | POA: Diagnosis not present

## 2015-10-04 NOTE — Progress Notes (Addendum)
Pt in for EKG only visit since starting Amiodarone. Rudi Coco NP to review EKG Amiodarone 200 mg bid started last week for rate control per Dr. Johney Frame.  EKG shows afib with v rate of 103 bpm, qrs int 90 ms, qtc 406 ms. He feels great.  He did test positive for genetic af and Jena Gauss with research was in to talk to pt. Pt would like to enroll in trial.He will have to stop amiodarone 2/27 and will be seen 3/7 to randomize. F/u with Dr. Johney Frame 8 weeks.

## 2015-10-17 ENCOUNTER — Inpatient Hospital Stay (HOSPITAL_COMMUNITY)
Admission: RE | Admit: 2015-10-17 | Payer: BC Managed Care – PPO | Source: Ambulatory Visit | Admitting: Nurse Practitioner

## 2015-10-24 ENCOUNTER — Other Ambulatory Visit: Payer: Self-pay | Admitting: *Deleted

## 2015-10-25 ENCOUNTER — Encounter: Payer: Self-pay | Admitting: Internal Medicine

## 2015-10-25 ENCOUNTER — Encounter: Payer: Self-pay | Admitting: *Deleted

## 2015-11-12 ENCOUNTER — Institutional Professional Consult (permissible substitution): Payer: BC Managed Care – PPO | Admitting: Internal Medicine

## 2015-12-03 ENCOUNTER — Encounter (HOSPITAL_COMMUNITY): Payer: Self-pay | Admitting: Nurse Practitioner

## 2015-12-03 ENCOUNTER — Ambulatory Visit (HOSPITAL_COMMUNITY)
Admission: RE | Admit: 2015-12-03 | Discharge: 2015-12-03 | Disposition: A | Discharge status: Home / Self Care | Payer: BC Managed Care – PPO | Source: Ambulatory Visit | Attending: Nurse Practitioner | Admitting: Nurse Practitioner

## 2015-12-03 ENCOUNTER — Other Ambulatory Visit: Payer: Self-pay

## 2015-12-03 DIAGNOSIS — I480 Paroxysmal atrial fibrillation: Secondary | ICD-10-CM | POA: Diagnosis not present

## 2015-12-03 DIAGNOSIS — Z01810 Encounter for preprocedural cardiovascular examination: Secondary | ICD-10-CM | POA: Diagnosis present

## 2015-12-03 DIAGNOSIS — I4891 Unspecified atrial fibrillation: Secondary | ICD-10-CM | POA: Insufficient documentation

## 2015-12-03 DIAGNOSIS — Z0189 Encounter for other specified special examinations: Secondary | ICD-10-CM | POA: Insufficient documentation

## 2015-12-03 NOTE — Progress Notes (Addendum)
Patient in for repeat ekg prior to cardioversion set-up per research protocol.  Pt was to be set up for cardioversion as part of research protocol now being on max drug. He states that he feels great. However, when he found out that cardioversion is not a covered part of the protocol, he declined the procedure. He said that he would have to pay 30% of the bill and he can not afford with the balance of this last hospital bill.  He will f/u with Dr. Johney Frame on Monday as pre scheduled. EKG shows afib with controlled rate at 93 bpm.

## 2015-12-03 NOTE — Patient Instructions (Addendum)
Cardioversion scheduled for Monday, May 1st  - Arrive at the [facility] and go to admitting at 1:00PM  -Do not eat or drink anything after midnight the night prior to your procedure.  - Take all your medication with a sip of water prior to arrival.  - You will not be able to drive home after your procedure.

## 2015-12-10 ENCOUNTER — Ambulatory Visit (HOSPITAL_COMMUNITY): Admit: 2015-12-10 | Payer: Self-pay | Admitting: Cardiology

## 2015-12-10 ENCOUNTER — Encounter (HOSPITAL_COMMUNITY): Payer: Self-pay

## 2015-12-10 ENCOUNTER — Encounter: Payer: Self-pay | Admitting: Internal Medicine

## 2015-12-10 ENCOUNTER — Ambulatory Visit (INDEPENDENT_AMBULATORY_CARE_PROVIDER_SITE_OTHER): Payer: BC Managed Care – PPO | Admitting: Internal Medicine

## 2015-12-10 VITALS — BP 112/74 | HR 109 | Ht 70.0 in | Wt 259.0 lb

## 2015-12-10 DIAGNOSIS — I519 Heart disease, unspecified: Secondary | ICD-10-CM

## 2015-12-10 SURGERY — CARDIOVERSION
Anesthesia: Monitor Anesthesia Care

## 2015-12-10 NOTE — Patient Instructions (Signed)
Medication Instructions:  Your physician recommends that you continue on your current medications as directed. Please refer to the Current Medication list given to you today.   Labwork: None ordered   Testing/Procedures: None ordered   Follow-Up:  Your physician recommends that you schedule a follow-up appointment in: 3 months with Donna Carroll, NP   Any Other Special Instructions Will Be Listed Below (If Applicable).     If you need a refill on your cardiac medications before your next appointment, please call your pharmacy.   

## 2015-12-10 NOTE — Progress Notes (Signed)
Electrophysiology Office Note   Date:  12/10/2015   ID:  Marvin Estrada, DOB 07-21-1961, MRN [id number]  Primary Electrophysiologist: Hillis Range, MD    CC: AF   History of Present Illness: Marvin Estrada is a 55 y.o. male who presents today for electrophysiology evaluation.   He continues to have persistence of afib but feels "great".  Denies CHF symptoms.  Unaware of his afib.  Primary concern is with costs of health care.  He has bee reluctant to consider cardioversion due to costs.  Today, he denies symptoms of palpitations, chest pain, shortness of breath, orthopnea, PND, lower extremity edema, claudication, dizziness, presyncope, syncope, bleeding, or neurologic sequela. The patient is tolerating medications without difficulties and is otherwise without complaint today.    Past Medical History  Diagnosis Date  . Hypertension   . Diabetes mellitus without complication (HCC)   . Kidney stones   . Persistent atrial fibrillation (HCC)   . Nonischemic cardiomyopathy (HCC)   . Obesity   . Snoring   . Persistent atrial fibrillation [facility])    Past Surgical History  Procedure Laterality Date  . Cardiac catheterization N/A 09/21/2015    Procedure: Left Heart Cath and Coronary Angiography;  Surgeon: Marykay Lex, MD;  Location: [facility] INVASIVE CV LAB;  Service: Cardiovascular;  Laterality: N/A;     Current Outpatient Prescriptions  Medication Sig Dispense Refill  . furosemide (LASIX) 40 MG tablet Take 1 tablet (40 mg total) by mouth 2 (two) times daily. 30 tablet 11  . lisinopril (PRINIVIL,ZESTRIL) 5 MG tablet Take 1 tablet (5 mg total) by mouth daily. 30 tablet 6  . potassium chloride SA (K-DUR,KLOR-CON) 20 MEQ tablet Take 1 tablet (20 mEq total) by mouth 2 (two) times daily. 30 tablet 6  . rivaroxaban (XARELTO) 20 MG TABS tablet Take 1 tablet (20 mg total) by mouth daily with supper. 30 tablet 6  . rosuvastatin (CRESTOR) 5 MG tablet Take 1 tablet (5 mg total) by mouth daily at 6  PM. 30 tablet 6   No current facility-administered medications for this visit.    Allergies:   Review of patient's allergies indicates no known allergies.   Social History:  The patient  reports that he has been smoking Cigarettes.  He has been smoking about 1.00 pack per day. He does not have any smokeless tobacco history on file. He reports that he drinks alcohol. He reports that he does not use illicit drugs.   Family History:  The patient's  family history includes Hypertension in his father and mother; Prostate cancer in his father.    ROS:  Please see the history of present illness.   All other systems are reviewed and negative.    PHYSICAL EXAM: VS:  BP 112/74 mmHg  Pulse 109  Ht 5\' 10"  (1.778 m)  Wt 259 lb (117.482 kg)  BMI 37.16 kg/m2 , BMI Body mass index is 37.16 kg/(m^2). GEN: Well nourished, well developed, in no acute distress HEENT: normal Neck: no JVD, carotid bruits, or masses Cardiac: iRRR; no murmurs, rubs, or gallops,no edema  Respiratory:  clear to auscultation bilaterally, normal work of breathing GI: soft, nontender, nondistended, + BS MS: no deformity or atrophy Skin: warm and dry  Neuro:  Strength and sensation are intact Psych: euthymic mood, full affect  EKG:  EKG is ordered today. The ekg ordered today shows afib, V rate 109 bpm   Recent Labs: 09/19/2015: ALT 28; B Natriuretic Peptide 772.9*; TSH 2.044 09/21/2015: Magnesium 1.8  09/22/2015: Hemoglobin 15.0; Platelets 191 09/28/2015: BUN 9; Creatinine, Ser 0.83; Potassium 4.3; Sodium 138    Lipid Panel  No results found for: CHOL, TRIG, HDL, CHOLHDL, VLDL, LDLCALC, LDLDIRECT   Wt Readings from Last 3 Encounters:  12/10/15 259 lb (117.482 kg)  12/03/15 259 lb 12.8 oz (117.845 kg)  09/26/15 243 lb 12.8 oz (110.587 kg)      Other studies Reviewed: Additional studies/ records that were reviewed today include: AF clinic notes,  I have also discussed with Research team today, prior echo reviewed      ASSESSMENT AND PLAN:  1.  Persistent afib He has been randomized in GENETIC AF.  I have strongly encouraged cardioversion however he is reluctant to do so.  He wishes to continue his current medicines at this time.   Reports compliance with anticoagulation.  2. Nonischemic CM Likely tachycardia mediated Enrolled in GENETIC AF Cardioversion advised as above Continue current medical therapy  3. Tobacco With his concerns of cost related to health care, I would have expected him to quit smoking by now  4. ETOH He says that he has quite  5. HTN Stable No change required today  6. Snoring Declines sleep study due to costs though it has been offered  Follow-up with Lupita Leash in the AF clinic every 3 months.  I will see when needed  Current medicines are reviewed at length with the patient today.   The patient does not have concerns regarding his medicines.  The following changes were made today:  none  Signed, Hillis Range, MD  12/10/2015 12:38 PM     [facility] [address] [place] [address] [phone] (office) [phone] (fax)

## 2016-01-01 ENCOUNTER — Telehealth: Payer: Self-pay | Admitting: Internal Medicine

## 2016-01-01 NOTE — Telephone Encounter (Signed)
New Message   *STAT* If patient is at the pharmacy, call can be transferred to refill team.   1. Which medications need to be refilled? (please list name of each medication and dose if known) potassium chloride SA (K-DUR,KLOR-CON) 20 MEQ tablet  2. Which pharmacy/location (including street and city if local pharmacy) is medication to be sent to? [facility] on [address]   3. Do they need a 30 day or 90 day supply? 30 day

## 2016-01-01 NOTE — Telephone Encounter (Signed)
01-01-16 correction on pharmacy listed on previous message, it should be [facility]

## 2016-01-02 ENCOUNTER — Other Ambulatory Visit: Payer: Self-pay | Admitting: *Deleted

## 2016-01-02 MED ORDER — POTASSIUM CHLORIDE CRYS ER 20 MEQ PO TBCR: 20.0000 meq | EXTENDED_RELEASE_TABLET | Freq: Two times a day (BID) | ORAL | Status: AC

## 2016-01-02 NOTE — Telephone Encounter (Signed)
Ok to refill under Dr Allred 

## 2016-01-02 NOTE — Telephone Encounter (Signed)
Yes, ok to fill

## 2016-01-10 ENCOUNTER — Encounter: Payer: Self-pay | Admitting: *Deleted

## 2016-01-10 DIAGNOSIS — Z006 Encounter for examination for normal comparison and control in clinical research program: Secondary | ICD-10-CM

## 2016-01-10 NOTE — Progress Notes (Signed)
Due to not having a cardioversion per protocol subject is to end participation in the Genetic AF research study per sponsor.  Will be having end of treatment visit on January 10, 2016 at 4pm.  Spoke with Gypsy Balsam and the subject is to resume Coreg 6.25 mg BID on morning of January 11, 2016. Subject is to schedule an AFIB clinic visit next week with Rudi Coco, ANP-C.

## 2016-01-11 ENCOUNTER — Encounter: Payer: Self-pay | Admitting: *Deleted

## 2016-01-11 DIAGNOSIS — Z006 Encounter for examination for normal comparison and control in clinical research program: Secondary | ICD-10-CM

## 2016-01-11 NOTE — Progress Notes (Signed)
Late entry: Subject signed last page of original consent allowing [place] to continue contacting him by phone or medical records to assess the status of his health.

## 2016-01-16 ENCOUNTER — Encounter: Payer: Self-pay | Admitting: *Deleted

## 2016-01-16 ENCOUNTER — Other Ambulatory Visit: Payer: Self-pay | Admitting: *Deleted

## 2016-01-16 DIAGNOSIS — Z006 Encounter for examination for normal comparison and control in clinical research program: Secondary | ICD-10-CM

## 2016-01-16 MED ORDER — CARVEDILOL 6.25 MG PO TABS: 6.2500 mg | ORAL_TABLET | Freq: Two times a day (BID) | ORAL | Status: AC

## 2016-01-16 NOTE — Progress Notes (Signed)
Late entry:  End of Study Visit 72 hour phone call made on [id number].  Subject had no complaints and feeling "like a champ."  No AEs to report.  Informed subject that he had refills on his beta blocker and offered an appointment with the Afib clinic. Subject declined to make the appointment and said that he would call the Afib clinic for an appointment himself.

## 2016-04-21 ENCOUNTER — Telehealth: Payer: Self-pay | Admitting: Internal Medicine

## 2016-04-21 NOTE — Telephone Encounter (Signed)
New Message  Pt c/o medication issue:  1. Name of Medication: Xarelto  2. How are you currently taking this medication (dosage and times per day)? 20 mg tablets once daily  3. Are you having a reaction (difficulty breathing--STAT)? No  4. What is your medication issue? Pt voiced MD Izola Price from [facility] in [place] wants to know if pt needs to continue this medication.  Pt voiced to f/u with physicians off:  [facility] [address] [phone] Pts PCP-MD Izola Price

## 2016-04-23 NOTE — Telephone Encounter (Signed)
Patient was due for afib clinic in Aug.  I do not see where med was stopped but he does need a follow up.  Will forward to afib clinic to get an appointment to discuss further

## 2016-04-23 NOTE — Telephone Encounter (Signed)
Dr Izola Price office notified of patient's need to continue xarelto long term due to [facility] score of 3. Pt reluctant to make appointment due to cost. States will call back when ready.

## 2016-06-14 IMAGING — CT CT RENAL STONE PROTOCOL
2 of 4 series · 17 of 46 positions shown, 19 images · non-contrast
Comparison: None.

CLINICAL DATA: Right flank pain, history of kidney stones, dysuria,
nausea

EXAM:
CT ABDOMEN AND PELVIS WITHOUT CONTRAST
TECHNIQUE: Multidetector CT imaging of the abdomen and pelvis was performed
following the standard protocol without IV contrast.

[Series 2: stone study 5.0 i30f 1 · axial · 0.98mm/px · z∈[-897,-432]mm · 14 of 103 slices shown, 16 images]
[im 5/103  soft-tissue]
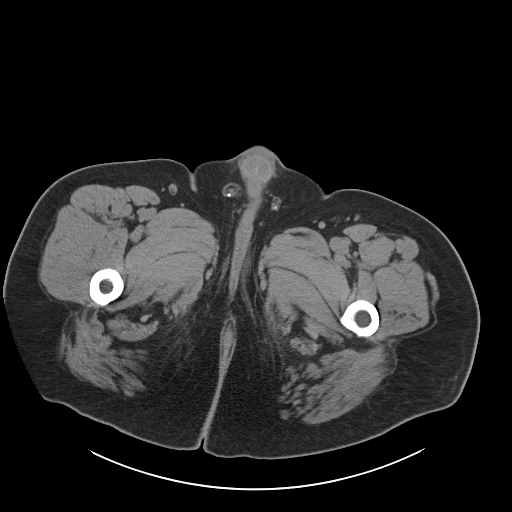
[im 5/103  bone]
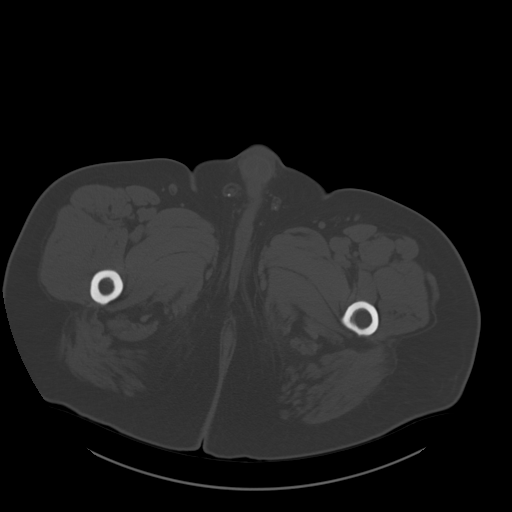
[im 13/103  soft-tissue]
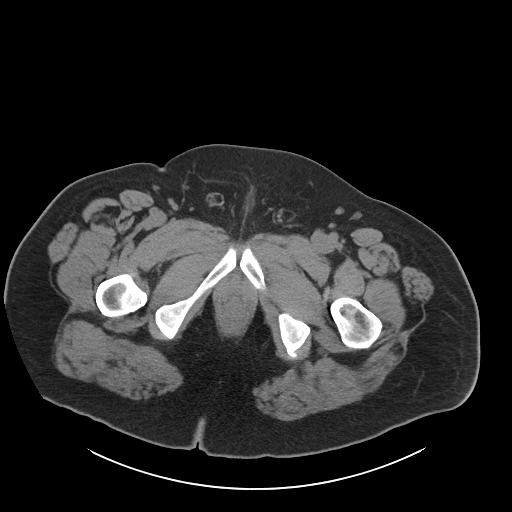
[im 21/103  soft-tissue]
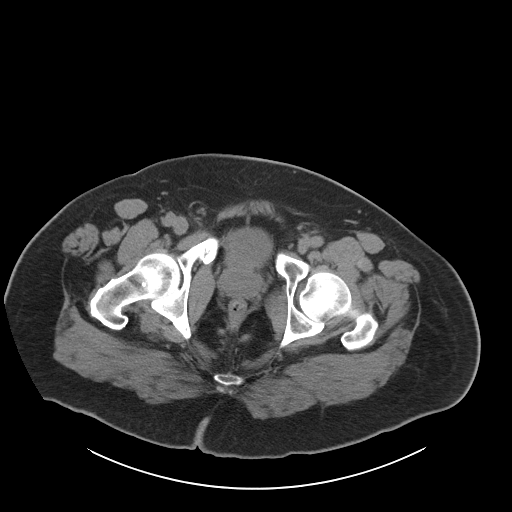
[im 29/103  soft-tissue]
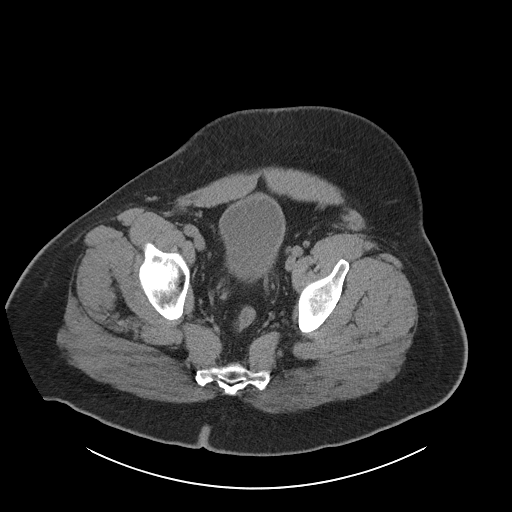
[im 33/103  soft-tissue]
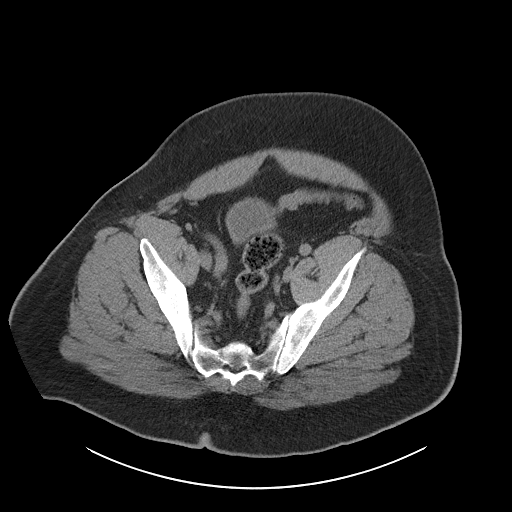
[im 41/103  soft-tissue]
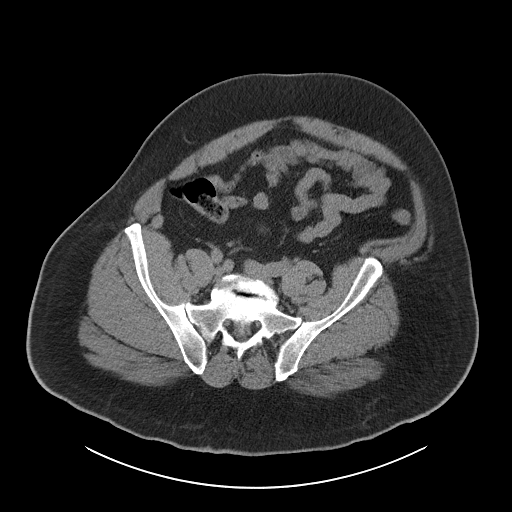
[im 49/103  soft-tissue]
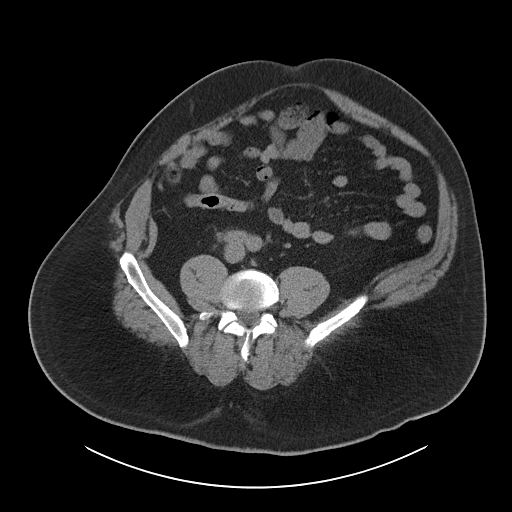
[im 54/103  soft-tissue]
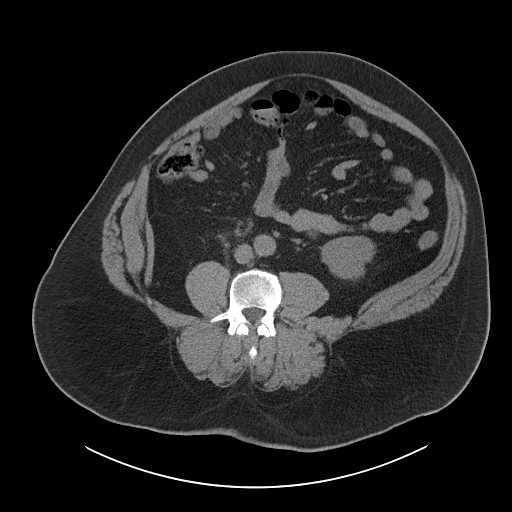
[im 62/103  soft-tissue]
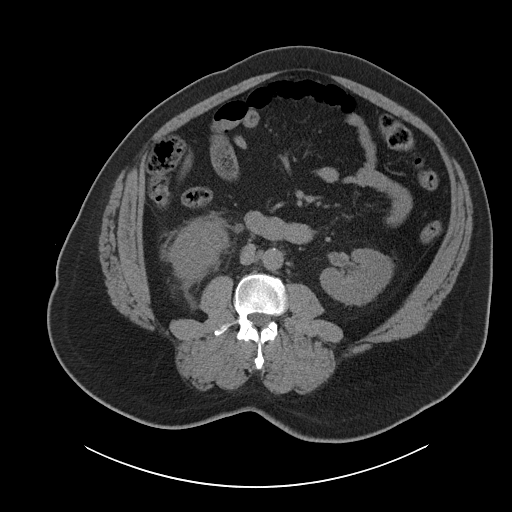
[im 62/103  bone]
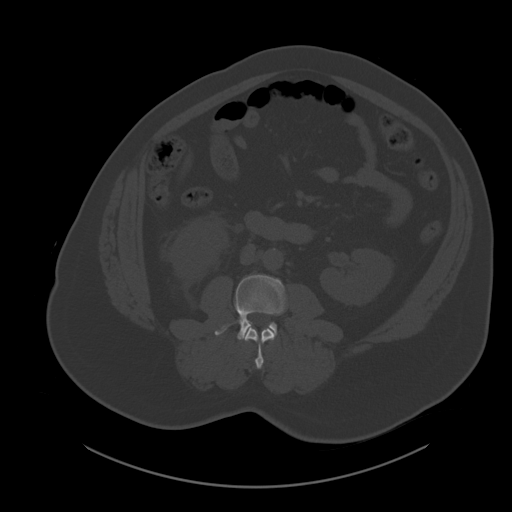
[im 70/103  soft-tissue]
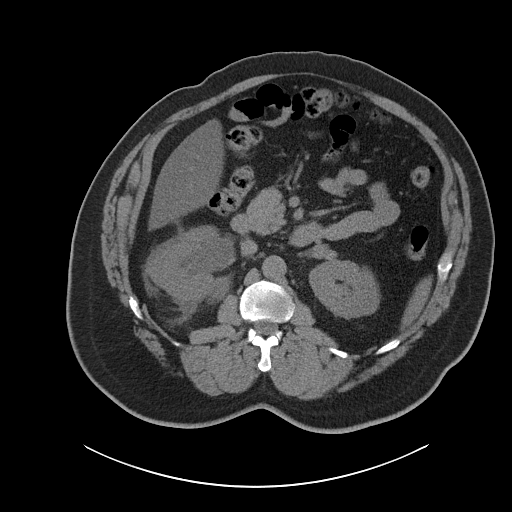
[im 78/103  soft-tissue]
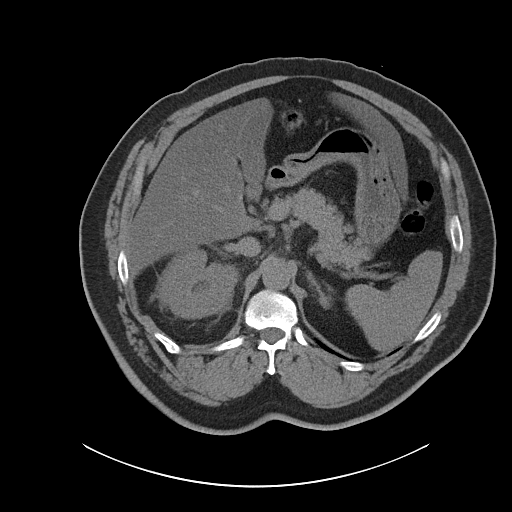
[im 82/103  soft-tissue]
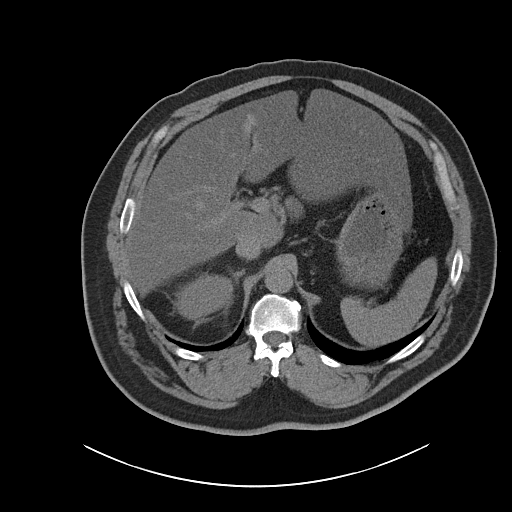
[im 90/103  soft-tissue]
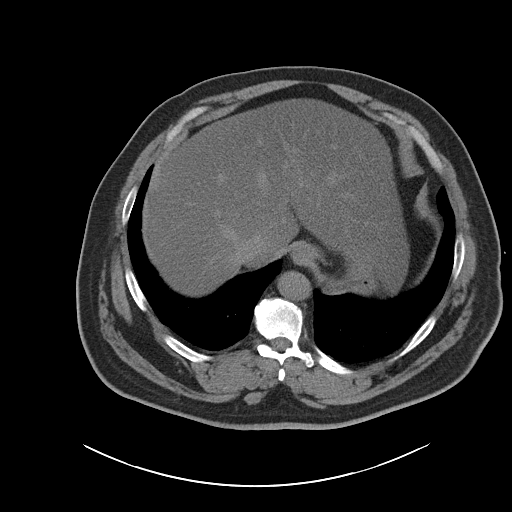
[im 98/103  soft-tissue]
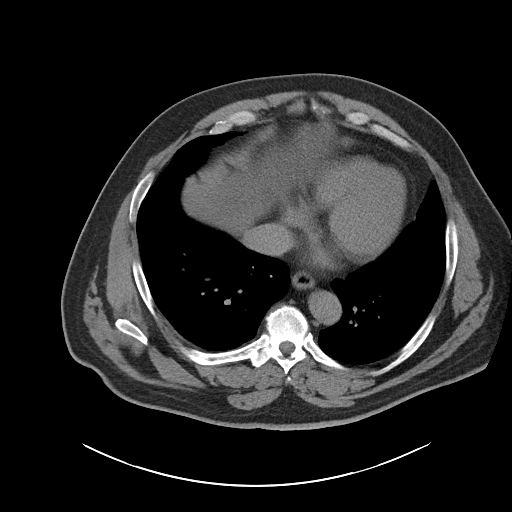

[Series 5: coronal soft tissue · coronal · 1.02mm/px · 3 of 116 slices shown]
[im 39/116  soft-tissue]
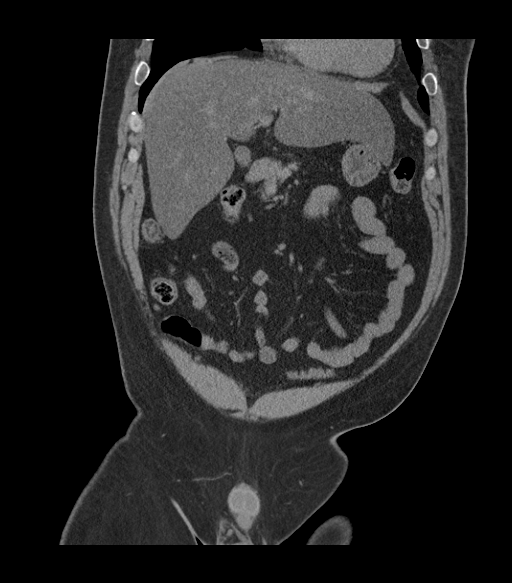
[im 52/116  soft-tissue]
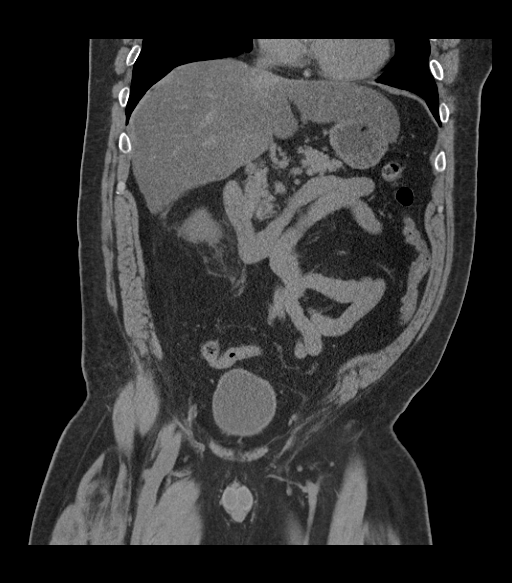
[im 64/116  soft-tissue]
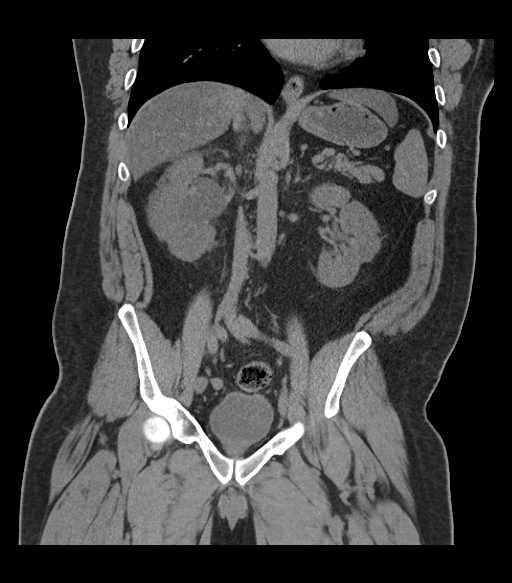

[17 of 46 positions shown; findings below may reference images not displayed]

FINDINGS: Lower chest:  Lung bases clear.

Hepatobiliary: Moderate to severe hepatic steatosis.

Gallbladder is unremarkable. No intrahepatic or extrahepatic ductal
dilatation.

Pancreas: Within normal limits.

Spleen: Within normal limits.

Adrenals/Urinary Tract: Adrenal glands are within normal limits.

Left kidney is within normal limits. No renal calculi or
hydronephrosis.

2.2 cm medial right upper pole renal cyst (series 2/ image 30).
Nonspecific right perinephric stranding. Mild to moderate right
hydronephrosis.

4 mm proximal/mid right ureteral calculus (coronal image 56).

Bladder is within normal limits.

Stomach/Bowel: Stomach is within normal limits.

No evidence of bowel obstruction.

Normal appendix.

Vascular/Lymphatic: Atherosclerotic calcifications of the abdominal
aorta and branch vessels.

No suspicious abdominopelvic lymphadenopathy.

Reproductive: Prostate is unremarkable.

Other: No abdominopelvic ascites.

Musculoskeletal: Degenerative changes of the visualized
thoracolumbar spine.
IMPRESSION: 4 mm proximal/mid right ureteral calculus. Mild to moderate right
hydronephrosis.

Moderate to severe hepatic steatosis.

## 2016-07-15 ENCOUNTER — Emergency Department (HOSPITAL_COMMUNITY)
Admission: EM | Admit: 2016-07-15 | Discharge: 2016-07-15 | Disposition: A | Discharge status: Home / Self Care | Payer: Worker's Compensation | Attending: Emergency Medicine | Admitting: Emergency Medicine

## 2016-07-15 ENCOUNTER — Encounter (HOSPITAL_COMMUNITY): Payer: Self-pay | Admitting: Emergency Medicine

## 2016-07-15 DIAGNOSIS — F17210 Nicotine dependence, cigarettes, uncomplicated: Secondary | ICD-10-CM | POA: Insufficient documentation

## 2016-07-15 DIAGNOSIS — I110 Hypertensive heart disease with heart failure: Secondary | ICD-10-CM | POA: Insufficient documentation

## 2016-07-15 DIAGNOSIS — W868XXA Exposure to other electric current, initial encounter: Secondary | ICD-10-CM | POA: Diagnosis not present

## 2016-07-15 DIAGNOSIS — Y929 Unspecified place or not applicable: Secondary | ICD-10-CM | POA: Diagnosis not present

## 2016-07-15 DIAGNOSIS — Z79899 Other long term (current) drug therapy: Secondary | ICD-10-CM | POA: Diagnosis not present

## 2016-07-15 DIAGNOSIS — Y939 Activity, unspecified: Secondary | ICD-10-CM | POA: Insufficient documentation

## 2016-07-15 DIAGNOSIS — Y999 Unspecified external cause status: Secondary | ICD-10-CM | POA: Insufficient documentation

## 2016-07-15 DIAGNOSIS — T23201A Burn of second degree of right hand, unspecified site, initial encounter: Secondary | ICD-10-CM | POA: Diagnosis not present

## 2016-07-15 DIAGNOSIS — E119 Type 2 diabetes mellitus without complications: Secondary | ICD-10-CM | POA: Diagnosis not present

## 2016-07-15 DIAGNOSIS — T754XXA Electrocution, initial encounter: Secondary | ICD-10-CM

## 2016-07-15 DIAGNOSIS — T23001A Burn of unspecified degree of right hand, unspecified site, initial encounter: Secondary | ICD-10-CM | POA: Diagnosis present

## 2016-07-15 DIAGNOSIS — T310 Burns involving less than 10% of body surface: Secondary | ICD-10-CM | POA: Diagnosis not present

## 2016-07-15 DIAGNOSIS — I5041 Acute combined systolic (congestive) and diastolic (congestive) heart failure: Secondary | ICD-10-CM | POA: Insufficient documentation

## 2016-07-15 LAB — CBC WITH DIFFERENTIAL/PLATELET
BASOS ABS: 0 10*3/uL (ref 0.0–0.1)
Basophils Relative: 0 %
EOS PCT: 4 %
Eosinophils Absolute: 0.3 10*3/uL (ref 0.0–0.7)
HEMATOCRIT: 43.3 % (ref 39.0–52.0)
Hemoglobin: 14.9 g/dL (ref 13.0–17.0)
LYMPHS PCT: 35 %
Lymphs Abs: 2.8 10*3/uL (ref 0.7–4.0)
MCH: 30.6 pg (ref 26.0–34.0)
MCHC: 34.4 g/dL (ref 30.0–36.0)
MCV: 88.9 fL (ref 78.0–100.0)
MONO ABS: 0.6 10*3/uL (ref 0.1–1.0)
MONOS PCT: 7 %
NEUTROS ABS: 4.2 10*3/uL (ref 1.7–7.7)
Neutrophils Relative %: 54 %
PLATELETS: 258 10*3/uL (ref 150–400)
RBC: 4.87 MIL/uL (ref 4.22–5.81)
RDW: 12.1 % (ref 11.5–15.5)
WBC: 7.8 10*3/uL (ref 4.0–10.5)

## 2016-07-15 LAB — I-STAT TROPONIN, ED: TROPONIN I, POC: 0 ng/mL (ref 0.00–0.08)

## 2016-07-15 LAB — COMPREHENSIVE METABOLIC PANEL
ALT: 37 U/L (ref 17–63)
ANION GAP: 6 (ref 5–15)
AST: 30 U/L (ref 15–41)
Albumin: 4.3 g/dL (ref 3.5–5.0)
Alkaline Phosphatase: 28 U/L — ABNORMAL LOW (ref 38–126)
BILIRUBIN TOTAL: 1.2 mg/dL (ref 0.3–1.2)
BUN: 12 mg/dL (ref 6–20)
CHLORIDE: 104 mmol/L (ref 101–111)
CO2: 26 mmol/L (ref 22–32)
Calcium: 9.2 mg/dL (ref 8.9–10.3)
Creatinine, Ser: 0.86 mg/dL (ref 0.61–1.24)
Glucose, Bld: 134 mg/dL — ABNORMAL HIGH (ref 65–99)
POTASSIUM: 3.8 mmol/L (ref 3.5–5.1)
Sodium: 136 mmol/L (ref 135–145)
TOTAL PROTEIN: 7.1 g/dL (ref 6.5–8.1)

## 2016-07-15 LAB — CK: CK TOTAL: 129 U/L (ref 49–397)

## 2016-07-15 MED ORDER — SILVER SULFADIAZINE 1 % EX CREA
TOPICAL_CREAM | Freq: Once | CUTANEOUS | Status: AC
  Administered 2016-07-15: 1 via TOPICAL
  Filled 2016-07-15: qty 85

## 2016-07-15 MED ORDER — MORPHINE SULFATE 15 MG PO TABS: 15.0000 mg | ORAL_TABLET | ORAL | 0 refills | Status: AC | PRN

## 2016-07-15 MED ORDER — SODIUM CHLORIDE 0.9 % IV BOLUS (SEPSIS)
1000.0000 mL | Freq: Once | INTRAVENOUS | Status: AC
  Administered 2016-07-15: 1000 mL via INTRAVENOUS

## 2016-07-15 MED ORDER — SILVER SULFADIAZINE 1 % EX CREA: 1.0000 "application " | TOPICAL_CREAM | Freq: Every day | CUTANEOUS | 0 refills | Status: AC

## 2016-07-15 NOTE — ED Notes (Signed)
Phlebotomy made aware of need for worker's comp process.

## 2016-07-15 NOTE — ED Provider Notes (Signed)
[facility]-EMERGENCY DEPT Provider Note   CSN: [id number] Arrival date & time: 07/15/16  1141     History   Chief Complaint Chief Complaint  Patient presents with  . Hand Burn  . Electric Shock    HPI Marvin Estrada is a 55 y.o. male.  55 yo M with a chief complaint of a hand burn. Patient was working on a 208 V 3 phase circuit. He reached in to pull the wires and they arced. Burn to the right hand. He denies any other areas of burn though he does have some singed hairs to the left forearm as well. Picked up by EMS en route patient had a period of diaphoresis that this was right after pushing fentanyl. Has a history of atrial fibrillation on Xarelto. He denies any chest pain shortness of breath syncope. Denies any burns to his face or his throat.   The history is provided by the patient and the EMS personnel.  Burn  The incident occurred less than 1 hour ago. The burns occurred at the workplace. The burns occurred while working on a project. Injury mechanism: electical wiring spark. The burns are located on the right hand. The burns appear red, blistered and painful. The pain is at a severity of 7/10. The pain is moderate. He has tried nothing for the symptoms. The treatment provided no relief.    Past Medical History:  Diagnosis Date  . Diabetes mellitus without complication (HCC)   . Hypertension   . Kidney stones   . Nonischemic cardiomyopathy (HCC)   . Obesity   . Persistent atrial fibrillation (HCC)   . Persistent atrial fibrillation (HCC)   . Snoring     Patient Active Problem List   Diagnosis Date Noted  . Chronic systolic dysfunction of left ventricle 12/10/2015  . Elevated troponin 09/21/2015  . Acute combined systolic and diastolic heart failure (HCC)   . Persistent atrial fibrillation (HCC) 09/19/2015  . Acute respiratory failure with hypoxia (HCC)   . Atrial fibrillation with RVR (HCC)   . Hypocalcemia   . Acute pulmonary edema (HCC)   . Altered mental status     . Chest pain with high risk for cardiac etiology 11/25/2013    Past Surgical History:  Procedure Laterality Date  . CARDIAC CATHETERIZATION N/A 09/21/2015   Procedure: Left Heart Cath and Coronary Angiography;  Surgeon: Marykay Lex, MD;  Location: [facility] INVASIVE CV LAB;  Service: Cardiovascular;  Laterality: N/A;       Home Medications    Prior to Admission medications   Medication Sig Start Date End Date Taking? Authorizing Provider  carvedilol (COREG) 6.25 MG tablet Take 1 tablet (6.25 mg total) by mouth 2 (two) times daily. 01/16/16   Amber Caryl Bis, NP  furosemide (LASIX) 40 MG tablet Take 1 tablet (40 mg total) by mouth 2 (two) times daily. 09/22/15   Bhavinkumar Bhagat, PA  lisinopril (PRINIVIL,ZESTRIL) 5 MG tablet Take 1 tablet (5 mg total) by mouth daily. 09/22/15   Bhavinkumar Bhagat, PA  morphine (MSIR) 15 MG tablet Take 1 tablet (15 mg total) by mouth every 4 (four) hours as needed for severe pain. 07/15/16   Melene Plan, DO  potassium chloride SA (K-DUR,KLOR-CON) 20 MEQ tablet Take 1 tablet (20 mEq total) by mouth 2 (two) times daily. 01/02/16   Hillis Range, MD  rivaroxaban (XARELTO) 20 MG TABS tablet Take 1 tablet (20 mg total) by mouth daily with supper. 09/22/15   Manson Passey, PA  rosuvastatin (CRESTOR)  5 MG tablet Take 1 tablet (5 mg total) by mouth daily at 6 PM. 09/22/15   Bhavinkumar Bhagat, PA  silver sulfADIAZINE (SILVADENE) 1 % cream Apply 1 application topically daily. 07/15/16   Melene Planan Taiden Raybourn, DO    Family History Family History  Problem Relation Age of Onset  . Hypertension Mother   . Hypertension Father   . Prostate cancer Father     Social History Social History  Substance Use Topics  . Smoking status: Current Some Day Smoker    Packs/day: 1.00    Types: Cigarettes  . Smokeless tobacco: Not on file     Comment: 1 pack per week  . Alcohol use 0.0 oz/week     Comment: 12 pk twice/week     Allergies   Patient has no known allergies.   Review of  Systems Review of Systems  Constitutional: Positive for diaphoresis. Negative for chills and fever.  HENT: Negative for congestion and facial swelling.   Eyes: Negative for discharge and visual disturbance.  Respiratory: Negative for shortness of breath.   Cardiovascular: Negative for chest pain and palpitations.  Gastrointestinal: Negative for abdominal pain, diarrhea and vomiting.  Musculoskeletal: Negative for arthralgias and myalgias.  Skin: Positive for color change and wound. Negative for rash.  Neurological: Negative for tremors, syncope and headaches.  Psychiatric/Behavioral: Negative for confusion and dysphoric mood.     Physical Exam Updated Vital Signs BP 116/79 (BP Location: Right Arm)   Pulse 86   Temp 97.8 F (36.6 C) (Oral)   Resp 20   Ht 5\' 10"  (1.778 m)   Wt 262 lb (118.8 kg)   SpO2 100%   BMI 37.59 kg/m   Physical Exam  Constitutional: He is oriented to person, place, and time. He appears well-developed and well-nourished.  HENT:  Head: Normocephalic and atraumatic.  Eyes: EOM are normal. Pupils are equal, round, and reactive to light.  Neck: Normal range of motion. Neck supple. No JVD present.  Cardiovascular: Normal rate and regular rhythm.  Exam reveals no gallop and no friction rub.   No murmur heard. Pulmonary/Chest: No respiratory distress. He has no wheezes.  Abdominal: He exhibits no distension and no mass. There is no tenderness. There is no rebound and no guarding.    Musculoskeletal: Normal range of motion. He exhibits tenderness.       Hands: Neurological: He is alert and oriented to person, place, and time.  Skin: No rash noted. No pallor.  Psychiatric: He has a normal mood and affect. His behavior is normal.  Nursing note and vitals reviewed.    ED Treatments / Results  Labs (all labs ordered are listed, but only abnormal results are displayed) Labs Reviewed  COMPREHENSIVE METABOLIC PANEL - Abnormal; Notable for the following:        Result Value   Glucose, Bld 134 (*)    Alkaline Phosphatase 28 (*)    All other components within normal limits  CBC WITH DIFFERENTIAL/PLATELET  CK  I-STAT TROPOININ, ED    EKG  EKG Interpretation  Date/Time:  Tuesday July 15 2016 11:42:42 EST Ventricular Rate:  112 PR Interval:    QRS Duration: 94 QT Interval:  335 QTC Calculation: 443 R Axis:   59 Text Interpretation:  Atrial fibrillation No significant change since last tracing Confirmed by Rochell Mabie MD, DANIEL ([id number]) on 07/15/2016 11:52:25 AM       Radiology No results found.  Procedures Procedures (including critical care time)  Medications Ordered in ED Medications  silver sulfADIAZINE (SILVADENE) 1 % cream (not administered)  sodium chloride 0.9 % bolus 1,000 mL (0 mLs Intravenous Stopped 07/15/16 1253)     Initial Impression / Assessment and Plan / ED Course  I have reviewed the triage vital signs and the nursing notes.  Pertinent labs & imaging results that were available during my care of the patient were reviewed by me and considered in my medical decision making (see chart for details).  Clinical Course     55 yo M With a chief complaint of her right hand burn. This was an electrical injury. Patient states that he only has pain to the hand. He does have signs of diffuse injury with a rash on his abdomen. Having some diaphoresis and came in in A. fib with RVR. Has a history of atrial fibrillation at baseline. Due to this increased rate will obtain labs give fluids.  Patient's heart rate improved with IV fluids. Labs with no significant finding. Troponin 0. CK unremarkable. I feel this is most likely a patient with A. fib at baseline who had some tachycardia with pain. I discussed the results with the patient. I told him the current recommendation is for a 6 hour observation period in the emergency department. Patient is electing to go home. He will return for sudden worsening pain syncope chest pain shortness  of breath.  Burn follow up.   12:54 PM:  I have discussed the diagnosis/risks/treatment options with the patient and family and believe the pt to be eligible for discharge home to follow-up with Burn. We also discussed returning to the ED immediately if new or worsening sx occur. We discussed the sx which are most concerning (e.g., sudden worsening pain, fever, inability to tolerate by mouth) that necessitate immediate return. Medications administered to the patient during their visit and any new prescriptions provided to the patient are listed below.  Medications given during this visit Medications  silver sulfADIAZINE (SILVADENE) 1 % cream (not administered)  sodium chloride 0.9 % bolus 1,000 mL (0 mLs Intravenous Stopped 07/15/16 1253)     The patient appears reasonably screen and/or stabilized for discharge and I doubt any other medical condition or other [facility] requiring further screening, evaluation, or treatment in the ED at this time prior to discharge.    Final Clinical Impressions(s) / ED Diagnoses   Final diagnoses:  Electrical shock of hand, initial encounter  Burn (any degree) involving less than 10% of body surface    New Prescriptions New Prescriptions   MORPHINE (MSIR) 15 MG TABLET    Take 1 tablet (15 mg total) by mouth every 4 (four) hours as needed for severe pain.   SILVER SULFADIAZINE (SILVADENE) 1 % CREAM    Apply 1 application topically daily.     Melene Plan, DO 07/15/16 1254

## 2016-07-15 NOTE — ED Notes (Signed)
Patient's boss at bedside states that patient needs worker's comp paperwork done prior to leaving and advised that paperwork should have been faxed here, this RN advised that no paperwork was received, pts boss contacted employer again to have paperwork sent.

## 2016-07-15 NOTE — ED Triage Notes (Signed)
Pt arrives via [facility], pt reports he was working on some electrical wires when he grabbed a 208 volt wire with his right hand. Pt reports feeling a shock in his right hand, denies loc. Right hand has some 3rd degree burns and darkened skin present. Pt has hx of afib with pulse 120-160 per ems, denies cp or sob. Pt received fentanyl and 4mg  zofran pta. Pt a/ox4,nad.

## 2017-01-13 IMAGING — CR DG CHEST 1V PORT
1 series · 1 of 1 positions shown · non-contrast
Comparison: Study obtained earlier in the day

CLINICAL DATA: Hypoxia

EXAM:
PORTABLE CHEST 1 VIEW

[AP]
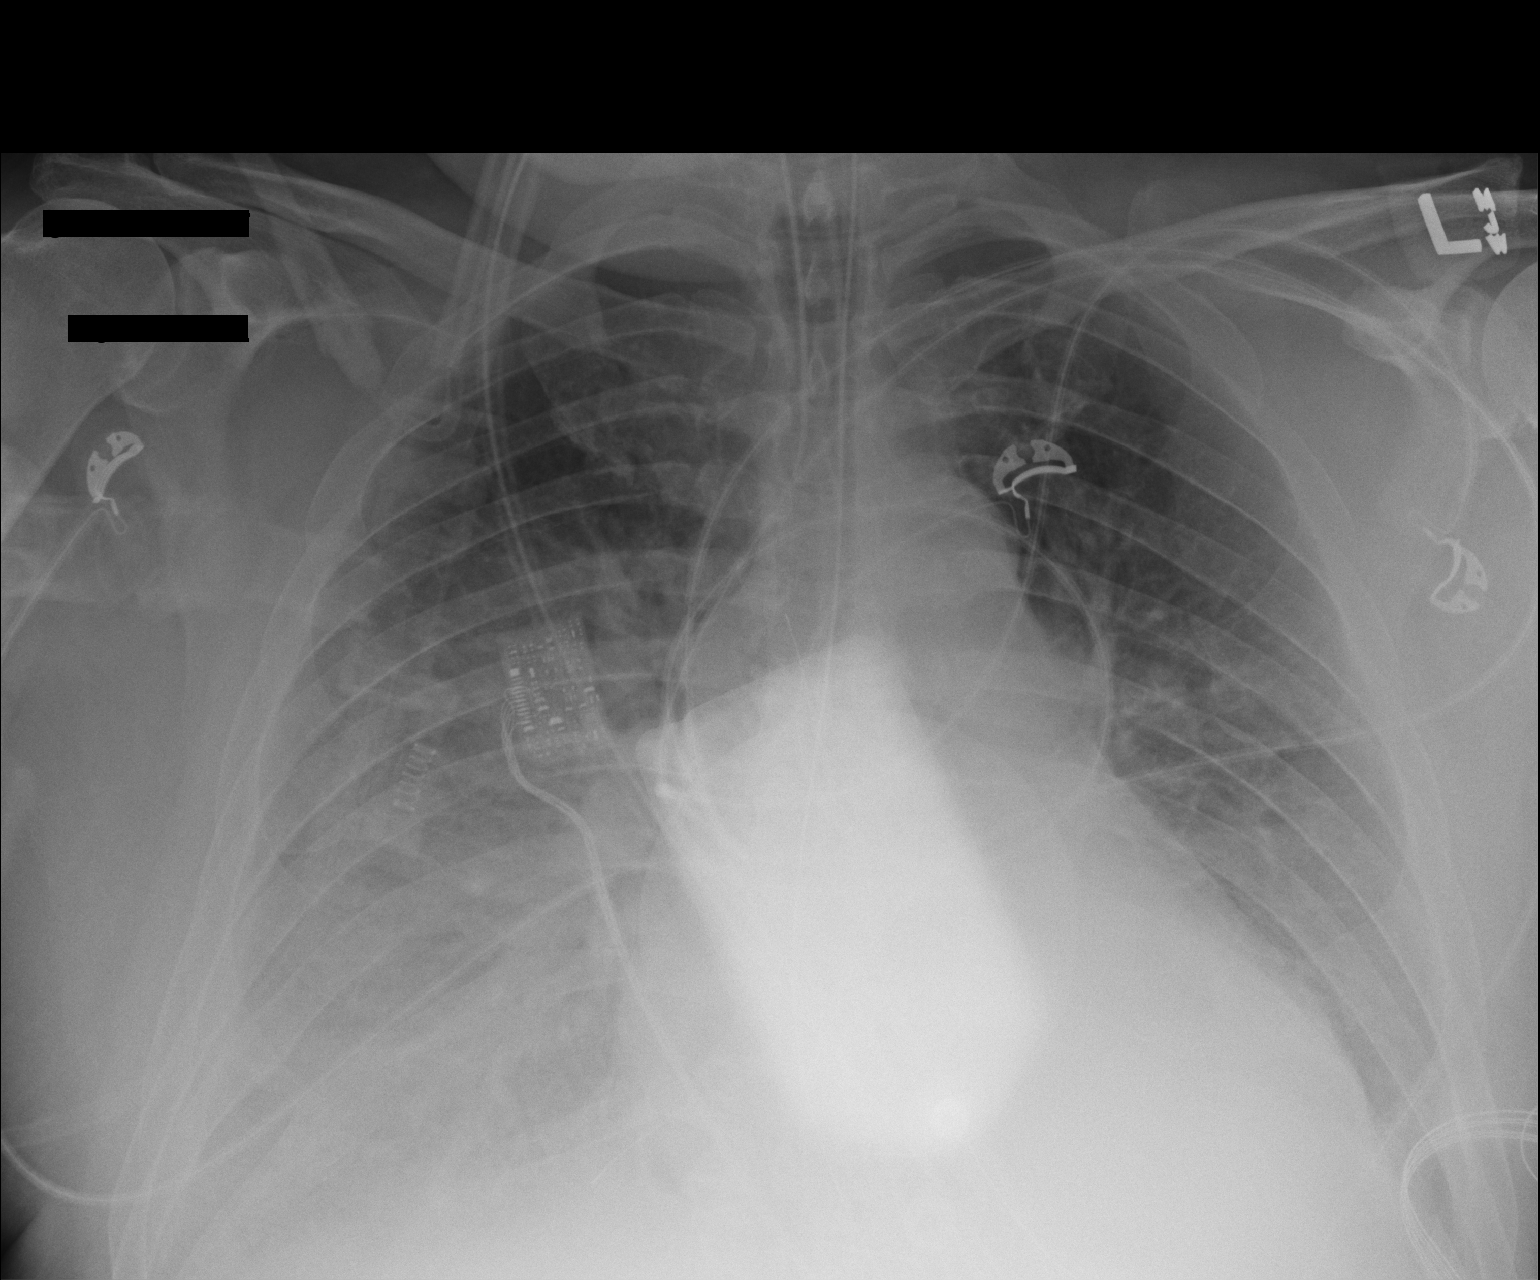

[1 of 1 positions shown; findings below may reference images not displayed]

FINDINGS: Endotracheal tube tip is 5.3 cm above the carina. Nasogastric tube
tip and side port are below the diaphragm. No pneumothorax. There is
a a right pleural effusion with hazy airspace opacity in the right
mid and lower lung zones, stable. There is new consolidation in the
left base. Heart is borderline enlarged with pulmonary vascularity
within normal limits. No adenopathy.
IMPRESSION: Tube positions as described without pneumothorax. New left lower
lobe consolidation. This finding raises concern for possible
aspiration. There is a moderate right pleural effusion with hazy
opacity in the right mid and lower lung zones, not felt to be
changed. Question underlying pneumonia or aspiration right lung.

## 2017-01-14 IMAGING — CR DG CHEST 1V PORT
2 series · 2 of 2 positions shown · non-contrast
Comparison: 09/19/2015 and earlier.

CLINICAL DATA: 54-year-old male with acute respiratory failure.
Hypoxia, atrial fibrillation. Initial encounter.

EXAM:
PORTABLE CHEST 1 VIEW

[AP (1 of 2)]
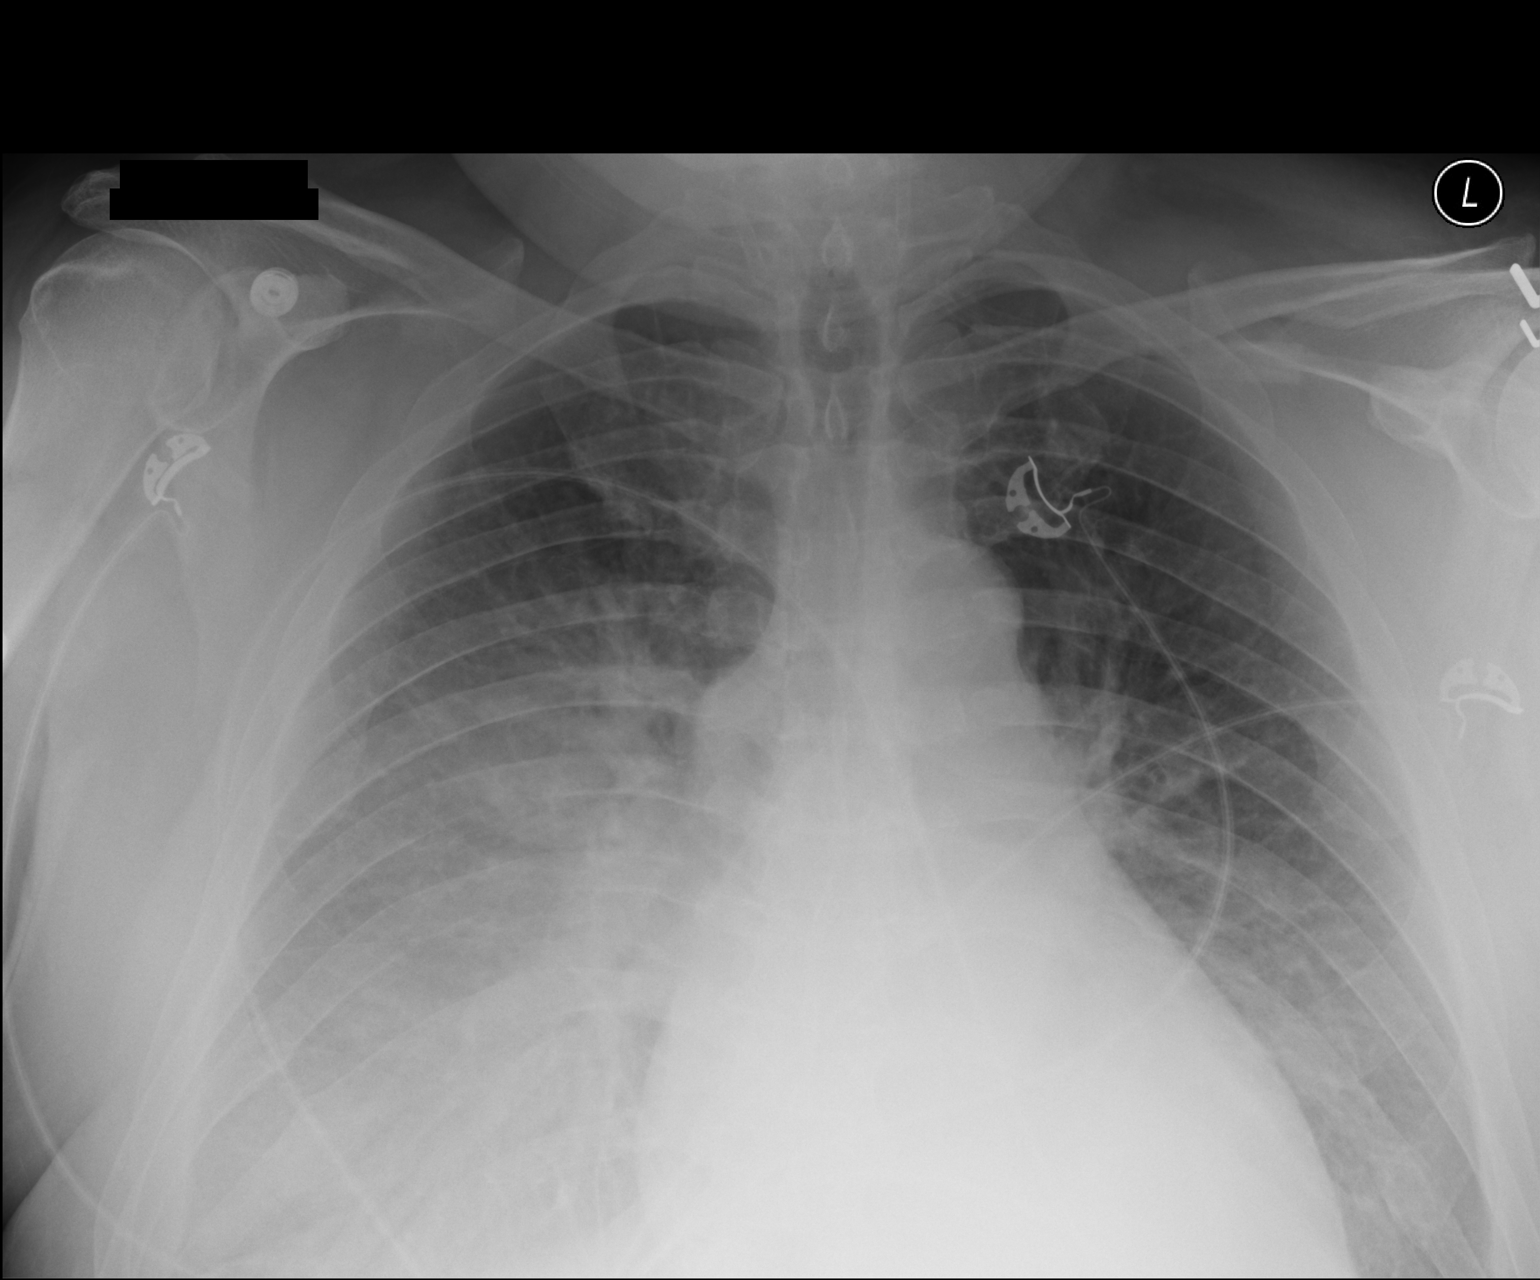

[AP (2 of 2)]
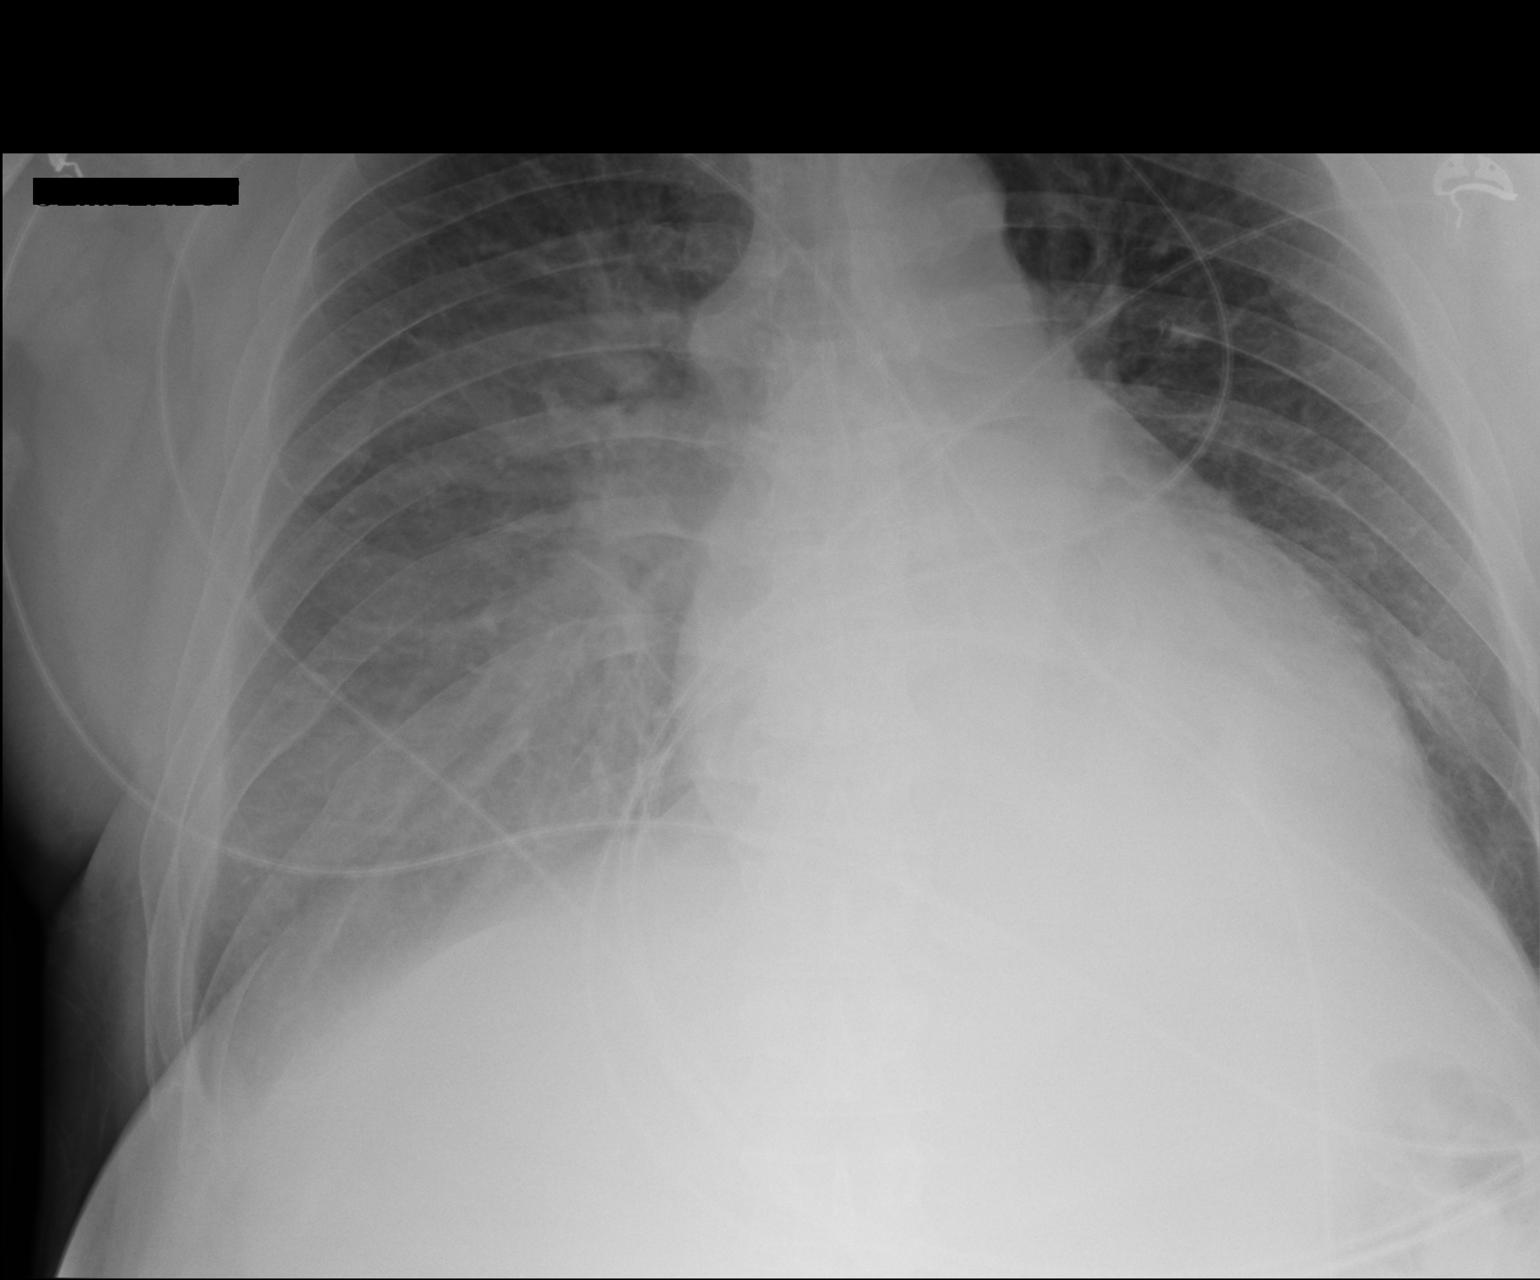

[2 of 2 positions shown; findings below may reference images not displayed]

FINDINGS: Portable AP semi upright view at 2750 hours. Extubated. Enteric tube
removed. Stable lung volumes. Continued veiling bilateral veiling
opacity, greater on the right. Dense retrocardiac opacity. No
pneumothorax. Overall stable ventilation. Stable cardiac size and
mediastinal contours.
IMPRESSION: 1. Extubated and enteric tube removed.
2. Stable ventilation with bilateral veiling opacity suspicious for
pleural effusions and dense lower lobe collapse or consolidation.

## 2023-05-04 ENCOUNTER — Encounter (HOSPITAL_COMMUNITY): Payer: Self-pay

## 2023-05-04 ENCOUNTER — Other Ambulatory Visit: Payer: Self-pay

## 2023-05-04 ENCOUNTER — Emergency Department (HOSPITAL_COMMUNITY): Payer: BC Managed Care – PPO

## 2023-05-04 ENCOUNTER — Emergency Department (HOSPITAL_COMMUNITY)
Admission: EM | Admit: 2023-05-04 | Discharge: 2023-05-04 | Disposition: A | Discharge status: Home / Self Care | Payer: BC Managed Care – PPO | Attending: Emergency Medicine | Admitting: Emergency Medicine

## 2023-05-04 DIAGNOSIS — E119 Type 2 diabetes mellitus without complications: Secondary | ICD-10-CM | POA: Insufficient documentation

## 2023-05-04 DIAGNOSIS — Z79899 Other long term (current) drug therapy: Secondary | ICD-10-CM | POA: Insufficient documentation

## 2023-05-04 DIAGNOSIS — Z7901 Long term (current) use of anticoagulants: Secondary | ICD-10-CM | POA: Diagnosis not present

## 2023-05-04 DIAGNOSIS — I10 Essential (primary) hypertension: Secondary | ICD-10-CM | POA: Diagnosis not present

## 2023-05-04 DIAGNOSIS — G510 Bell's palsy: Secondary | ICD-10-CM | POA: Diagnosis not present

## 2023-05-04 DIAGNOSIS — R29810 Facial weakness: Secondary | ICD-10-CM | POA: Diagnosis present

## 2023-05-04 DIAGNOSIS — I6529 Occlusion and stenosis of unspecified carotid artery: Secondary | ICD-10-CM

## 2023-05-04 LAB — APTT: aPTT: 28 seconds (ref 24–36)

## 2023-05-04 LAB — PROTIME-INR
INR: 1.7 — ABNORMAL HIGH (ref 0.8–1.2)
Prothrombin Time: 19.8 seconds — ABNORMAL HIGH (ref 11.4–15.2)

## 2023-05-04 LAB — BASIC METABOLIC PANEL
Anion gap: 13 (ref 5–15)
BUN: 11 mg/dL (ref 6–20)
CO2: 26 mmol/L (ref 22–32)
Calcium: 9.7 mg/dL (ref 8.9–10.3)
Chloride: 98 mmol/L (ref 98–111)
Creatinine, Ser: 0.93 mg/dL (ref 0.61–1.24)
GFR, Estimated: >60 mL/min (ref >60)
Glucose, Bld: 209 mg/dL — ABNORMAL HIGH (ref 70–99)
Potassium: 4.2 mmol/L (ref 3.5–5.1)
Sodium: 137 mmol/L (ref 135–145)

## 2023-05-04 LAB — CBC WITH DIFFERENTIAL/PLATELET
Abs Immature Granulocytes: 0.02 10*3/uL (ref 0.00–0.07)
Basophils Absolute: 0.1 10*3/uL (ref 0.0–0.1)
Basophils Relative: 1 %
Eosinophils Absolute: 0.4 10*3/uL (ref 0.0–0.5)
Eosinophils Relative: 5 %
HCT: 49.4 % (ref 39.0–52.0)
Hemoglobin: 16.2 g/dL (ref 13.0–17.0)
Immature Granulocytes: 0 %
Lymphocytes Relative: 24 %
Lymphs Abs: 1.9 10*3/uL (ref 0.7–4.0)
MCH: 30.6 pg (ref 26.0–34.0)
MCHC: 32.8 g/dL (ref 30.0–36.0)
MCV: 93.2 fL (ref 80.0–100.0)
Monocytes Absolute: 0.9 10*3/uL (ref 0.1–1.0)
Monocytes Relative: 11 %
Neutro Abs: 4.7 10*3/uL (ref 1.7–7.7)
Neutrophils Relative %: 59 %
Platelets: 255 10*3/uL (ref 150–400)
RBC: 5.3 MIL/uL (ref 4.22–5.81)
RDW: 12.3 % (ref 11.5–15.5)
WBC: 7.9 10*3/uL (ref 4.0–10.5)
nRBC: 0 % (ref 0.0–0.2)

## 2023-05-04 LAB — TROPONIN I (HIGH SENSITIVITY)
Troponin I (High Sensitivity): 7 ng/L (ref <18)
Troponin I (High Sensitivity): 3 ng/L (ref <18)

## 2023-05-04 MED ORDER — PREDNISONE 20 MG PO TABS: 40.0000 mg | ORAL_TABLET | Freq: Every day | ORAL | 0 refills | Status: AC

## 2023-05-04 MED ORDER — CARBOXYMETHYLCELLULOSE SODIUM 1 % OP SOLN: 1.0000 [drp] | Freq: Three times a day (TID) | OPHTHALMIC | 12 refills | Status: AC

## 2023-05-04 MED ORDER — VALACYCLOVIR HCL 500 MG PO TABS
500.0000 mg | ORAL_TABLET | Freq: Once | ORAL | Status: AC
  Administered 2023-05-04: 500 mg via ORAL
  Filled 2023-05-04: qty 1

## 2023-05-04 MED ORDER — IOHEXOL 350 MG/ML SOLN
75.0000 mL | Freq: Once | INTRAVENOUS | Status: AC | PRN
  Administered 2023-05-04: 75 mL via INTRAVENOUS

## 2023-05-04 MED ORDER — PREDNISONE 20 MG PO TABS
60.0000 mg | ORAL_TABLET | Freq: Once | ORAL | Status: AC
  Administered 2023-05-04: 60 mg via ORAL
  Filled 2023-05-04: qty 3

## 2023-05-04 MED ORDER — VALACYCLOVIR HCL 500 MG PO TABS: 500.0000 mg | ORAL_TABLET | Freq: Two times a day (BID) | ORAL | 0 refills | Status: AC

## 2023-05-04 NOTE — ED Provider Notes (Signed)
[name] Provider Note   CSN: [id number] Arrival date & time: 05/04/23  4098     History  Chief Complaint  Patient presents with   Rt Side facial droop    Marvin Estrada is a 62 y.o. male.  HPI     This is a 62 year old male who presents with left-sided facial droop.  Patient reports that he woke up yesterday morning.  He is unable to close his eye and has noted left-sided facial droop persistently.  He denies any additional neurologic symptoms to me.  However nursing notes indicate that he was having some tingling in the hand.  Denies any history of stroke.  Does have risk factors including hypertension.  He is currently on Xarelto.  Patient does report recent upper respiratory illness but no fevers.  Home Medications Prior to Admission medications   Medication Sig Start Date End Date Taking? Authorizing Provider  carvedilol (COREG) 6.25 MG tablet Take 1 tablet (6.25 mg total) by mouth 2 (two) times daily. 01/16/16   Marily Lente, NP  furosemide (LASIX) 40 MG tablet Take 1 tablet (40 mg total) by mouth 2 (two) times daily. 09/22/15   Bhagat, Sharrell Ku, PA  lisinopril (PRINIVIL,ZESTRIL) 5 MG tablet Take 1 tablet (5 mg total) by mouth daily. 09/22/15   Bhagat, Sharrell Ku, PA  morphine (MSIR) 15 MG tablet Take 1 tablet (15 mg total) by mouth every 4 (four) hours as needed for severe pain. 07/15/16   Melene Plan, DO  potassium chloride SA (K-DUR,KLOR-CON) 20 MEQ tablet Take 1 tablet (20 mEq total) by mouth 2 (two) times daily. 01/02/16   Allred, Fayrene Fearing, MD  rivaroxaban (XARELTO) 20 MG TABS tablet Take 1 tablet (20 mg total) by mouth daily with supper. 09/22/15   Bhagat, Sharrell Ku, PA  rosuvastatin (CRESTOR) 5 MG tablet Take 1 tablet (5 mg total) by mouth daily at 6 PM. 09/22/15   Bhagat, Bhavinkumar, PA  silver sulfADIAZINE (SILVADENE) 1 % cream Apply 1 application topically daily. 07/15/16   Melene Plan, DO      Allergies    Patient has no  known allergies.    Review of Systems   Review of Systems  Constitutional:  Negative for fever.  Respiratory:  Negative for shortness of breath.   Cardiovascular:  Negative for chest pain.  Neurological:  Positive for facial asymmetry. Negative for seizures, weakness, numbness and headaches.  All other systems reviewed and are negative.   Physical Exam Updated Vital Signs BP (!) 125/101   Pulse 97   Temp 98 F (36.7 C) (Oral)   Resp 18   Ht 1.778 m (5\' 10" )   Wt 127 kg   SpO2 98%   BMI 40.18 kg/m  Physical Exam Vitals and nursing note reviewed.  Constitutional:      Appearance: He is well-developed. He is obese. He is not ill-appearing.  HENT:     Head: Normocephalic and atraumatic.     Nose: Nose normal.     Mouth/Throat:     Mouth: Mucous membranes are moist.  Eyes:     Pupils: Pupils are equal, round, and reactive to light.  Cardiovascular:     Rate and Rhythm: Normal rate and regular rhythm.     Heart sounds: Normal heart sounds. No murmur heard. Pulmonary:     Effort: Pulmonary effort is normal. No respiratory distress.     Breath sounds: Normal breath sounds.  Abdominal:     Palpations: Abdomen is soft.  Tenderness: There is no abdominal tenderness.  Musculoskeletal:     Cervical back: Neck supple.  Lymphadenopathy:     Cervical: No cervical adenopathy.  Skin:    General: Skin is warm and dry.  Neurological:     Mental Status: He is alert and oriented to person, place, and time.     Comments: Left-sided facial droop noted which involves the forehead, 5 out of 5 strength in all 4 extremities, normal gait, no dysmetria to finger-nose-finger  Psychiatric:        Mood and Affect: Mood normal.     ED Results / Procedures / Treatments   Labs (all labs ordered are listed, but only abnormal results are displayed) Labs Reviewed  BASIC METABOLIC PANEL - Abnormal; Notable for the following components:      Result Value   Glucose, Bld 209 (*)    All other  components within normal limits  PROTIME-INR - Abnormal; Notable for the following components:   Prothrombin Time 19.8 (*)    INR 1.7 (*)    All other components within normal limits  CBC WITH DIFFERENTIAL/PLATELET  APTT  TROPONIN I (HIGH SENSITIVITY)  TROPONIN I (HIGH SENSITIVITY)    EKG None  Radiology CT ANGIO HEAD NECK W WO CM  Result Date: 05/04/2023 CLINICAL DATA:  Right facial droop.  Stroke presentation. EXAM: CT ANGIOGRAPHY HEAD AND NECK WITH AND WITHOUT CONTRAST TECHNIQUE: Multidetector CT imaging of the head and neck was performed using the standard protocol during bolus administration of intravenous contrast. Multiplanar CT image reconstructions and MIPs were obtained to evaluate the vascular anatomy. Carotid stenosis measurements (when applicable) are obtained utilizing NASCET criteria, using the distal internal carotid diameter as the denominator. RADIATION DOSE REDUCTION: This exam was performed according to the departmental dose-optimization program which includes automated exposure control, adjustment of the mA and/or kV according to patient size and/or use of iterative reconstruction technique. 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 Otherwise, the brain has a normal appearance by CT. No sign of acute stroke, mass, hemorrhage, hydrocephalus or extra-axial collection. Vascular: There is atherosclerotic calcification of the major vessels at the base of the brain. Skull: Negative Sinuses/Orbits: Clear/normal Other: None Review of the MIP images confirms the above findings CTA NECK FINDINGS Aortic arch: Aortic arch is normal. Branching pattern is normal with the common normal variant of the left vertebral artery arising directly from the arch. Right carotid system: Common carotid artery widely patent to the bifurcation. Mild soft plaque at the bifurcation  but no stenosis. Mild calcified plaque in the distal bulb but no stenosis. Beyond that, the cervical ICA is tortuous but widely patent. Left carotid system: Common carotid artery widely patent to the bifurcation. Circumferential soft and calcified plaque of the ICA bulb on the left. Minimal diameter is at the distal bulb measuring 2 mm. Compared to a more distal cervical ICA diameter of 4 mm, this indicates a 50% stenosis. Vertebral arteries: Both vertebral artery origins appear normal. Left arises from the arch. Both vertebral arteries are widely patent through the cervical region to the foramen magnum. Skeleton: Ordinary cervical spondylosis. Other neck: No mass or lymphadenopathy. Upper chest: Lung apices are clear. Review of the MIP images confirms the above findings CTA HEAD FINDINGS Anterior circulation: Both internal carotid arteries are patent through the skull base and siphon regions. Ordinary siphon atherosclerotic calcification but no stenosis  greater than 30%. The anterior and middle cerebral vessels are patent. No large vessel occlusion or proximal stenosis. No aneurysm or vascular malformation. Posterior circulation: Both vertebral arteries are patent through the foramen magnum to the basilar artery. No basilar stenosis. Posterior circulation branch vessels are patent. Venous sinuses: Patent and normal. Anatomic variants: None significant. Review of the MIP images confirms the above findings IMPRESSION: 1. No acute intracranial finding by CT. 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 2. 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 No stenosis on the right. 3. No intracranial large or medium vessel occlusion or correctable proximal stenosis. Electronically Signed   By: Paulina Fusi M.D.   On: 05/04/2023 12:15   MR BRAIN WO CONTRAST  Result Date: 05/04/2023 CLINICAL DATA:  Transient ischemic attack. Right facial droop. TIA suspected.  EXAM: MRI HEAD WITHOUT CONTRAST TECHNIQUE: Multiplanar, multiecho pulse sequences of the brain and surrounding structures were obtained without intravenous contrast. COMPARISON:  None Available. FINDINGS: Brain: Diffusion imaging does not show any acute or subacute infarction. No focal abnormality affects the brainstem. There are a few old small vessel infarctions within the right cerebellum. Cerebral hemispheres appear normal for age. No evidence of large or small vessel stroke. Mild age related volume loss. No mass, hemorrhage, hydrocephalus or extra-axial collection. Vascular: Major vessels at the base of the brain show flow. Skull and upper cervical spine: Negative Sinuses/Orbits: Clear/normal Other: None IMPRESSION: 1. No acute finding by MRI. No evidence of recent ischemic event. 2. Few old small vessel infarctions within the right cerebellum. Electronically Signed   By: Paulina Fusi M.D.   On: 05/04/2023 12:10    Procedures Procedures    Medications Ordered in ED Medications  iohexol (OMNIPAQUE) 350 MG/ML injection 75 mL (75 mLs Intravenous Contrast Given 05/04/23 1201)  predniSONE (DELTASONE) tablet 60 mg (60 mg Oral Given 05/04/23 1655)  valACYclovir (VALTREX) tablet 500 mg (500 mg Oral Given 05/04/23 1704)    ED Course/ Medical Decision Making/ A&P                                 Medical Decision Making Risk Prescription drug management.   This patient presents to the ED for concern of facial droop, this involves an extensive number of treatment options, and is a complaint that carries with it a high risk of complications and morbidity.  I considered the following differential and admission for this acute, potentially life threatening condition.  The differential diagnosis includes stroke, Bell's palsy  MDM:    This is a 62 year old male who presents with left-sided facial droop.  Exam is consistent with Bell's palsy.  Previously from triage had imaging including CT, CTA, MRI which  is negative for acute stroke.  He does have some chronic scheming changes but is on Xarelto.  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  Will refer to vascular surgery.  Will treat with Valtrex, eye lubricant, and prednisone.  (Labs, imaging, consults)  Labs: I Ordered, and personally interpreted labs.  The pertinent results include: Stroke panel  Imaging Studies ordered: I ordered imaging studies including CT, CTA head, MRI I independently visualized and interpreted imaging. I agree with the radiologist interpretation  Additional history obtained from chart review.  External records from outside source obtained and reviewed including prior evaluations  Cardiac Monitoring: The patient was maintained on a cardiac monitor.  If on the cardiac monitor, I personally viewed and interpreted the cardiac monitored which showed an underlying rhythm of: Sinus rhythm  Reevaluation: After the interventions noted above, I reevaluated the patient and found that they have :improved  Social Determinants of Health:  lives independently  Disposition: Discharge  Co morbidities that complicate the patient evaluation  Past Medical History:  Diagnosis Date   Diabetes mellitus without complication (HCC)    Hypertension    Kidney stones    Nonischemic cardiomyopathy (HCC)    Obesity    Persistent atrial fibrillation (HCC)    Persistent atrial fibrillation (HCC)    Snoring      Medicines Meds ordered this encounter  Medications   iohexol (OMNIPAQUE) 350 MG/ML injection 75 mL   predniSONE (DELTASONE) tablet 60 mg   valACYclovir (VALTREX) tablet 500 mg    I have reviewed the patients home medicines and have made adjustments as needed  Problem List / ED Course: Problem List Items Addressed This Visit   None Visit Diagnoses     Bell's palsy    -  Primary                   Final Clinical Impression(s) / ED Diagnoses Final diagnoses:  Bell's palsy    Rx / DC  Orders ED Discharge Orders     None         Shon Baton, MD 05/04/23 1721

## 2023-05-04 NOTE — Discharge Instructions (Addendum)
You were seen today for facial weakness.  Your exam is consistent with Bell's palsy.  You need to take Valtrex and steroids.  Follow-up closely with your primary physician.  Make sure that you are keeping your eye lubricated.  Additionally, your imaging did suggest some carotid artery stenosis.  You are appropriately on Xarelto which is good stroke prophylaxis because of your A-fib; however, you should follow-up with vascular surgery regarding this stenosis and whether you need any surveillance imaging or intervention.

## 2023-05-04 NOTE — ED Triage Notes (Signed)
Pt came in via POV d/t Lt sided facial droop that started yesterday morning when he woke up at 0730. Denies being seen when these s/s started, denies pain. A/Ox4. Equal strength in grips bil while in triage, Lt sided facial droop noted, sensations intact bil in upper/lower extremities. Pt does report that his Rt hand feels "tingly." Reports the tingling in Rt hand suddenly stopped while in triage at 0904.
